# Patient Record
Sex: Female | Born: 1970 | Race: White | Hispanic: No | Smoking: Never smoker
Health system: Southern US, Community
[De-identification: ages and names within clinical notes are randomized; demographics above are authoritative.]

## PROBLEM LIST (undated history)

## (undated) DIAGNOSIS — Z973 Presence of spectacles and contact lenses: Secondary | ICD-10-CM

## (undated) DIAGNOSIS — R112 Nausea with vomiting, unspecified: Secondary | ICD-10-CM

## (undated) DIAGNOSIS — Z9889 Other specified postprocedural states: Secondary | ICD-10-CM

## (undated) DIAGNOSIS — N920 Excessive and frequent menstruation with regular cycle: Secondary | ICD-10-CM

## (undated) DIAGNOSIS — D649 Anemia, unspecified: Secondary | ICD-10-CM

## (undated) DIAGNOSIS — D259 Leiomyoma of uterus, unspecified: Secondary | ICD-10-CM

## (undated) HISTORY — PX: SPINE SURGERY: SHX786

## (undated) HISTORY — PX: KNEE SURGERY: SHX244

## (undated) HISTORY — DX: Anemia, unspecified: D64.9

---

## 1997-02-04 HISTORY — PX: WRIST SURGERY: SHX841

## 1997-08-01 ENCOUNTER — Encounter (HOSPITAL_COMMUNITY): Admission: RE | Admit: 1997-08-01 | Discharge: 1997-10-30 | Payer: Self-pay | Admitting: *Deleted

## 1998-02-04 HISTORY — PX: DILATION AND CURETTAGE OF UTERUS: SHX78

## 1998-03-02 ENCOUNTER — Other Ambulatory Visit: Admission: RE | Admit: 1998-03-02 | Discharge: 1998-03-02 | Payer: Self-pay | Admitting: Obstetrics & Gynecology

## 1999-01-19 ENCOUNTER — Ambulatory Visit (HOSPITAL_COMMUNITY): Admission: RE | Admit: 1999-01-19 | Discharge: 1999-01-19 | Payer: Self-pay | Admitting: Obstetrics and Gynecology

## 1999-02-22 ENCOUNTER — Ambulatory Visit (HOSPITAL_COMMUNITY): Admission: RE | Admit: 1999-02-22 | Discharge: 1999-02-22 | Payer: Self-pay | Admitting: Obstetrics and Gynecology

## 1999-02-22 ENCOUNTER — Encounter: Payer: Self-pay | Admitting: Obstetrics and Gynecology

## 1999-03-23 ENCOUNTER — Other Ambulatory Visit: Admission: RE | Admit: 1999-03-23 | Discharge: 1999-03-23 | Payer: Self-pay | Admitting: Obstetrics and Gynecology

## 1999-04-19 ENCOUNTER — Encounter: Payer: Self-pay | Admitting: Orthopedic Surgery

## 1999-04-19 ENCOUNTER — Ambulatory Visit (HOSPITAL_COMMUNITY): Admission: RE | Admit: 1999-04-19 | Discharge: 1999-04-19 | Payer: Self-pay | Admitting: Orthopedic Surgery

## 2000-01-17 ENCOUNTER — Inpatient Hospital Stay (HOSPITAL_COMMUNITY): Admission: AD | Admit: 2000-01-17 | Discharge: 2000-01-17 | Payer: Self-pay | Admitting: Obstetrics and Gynecology

## 2000-01-18 ENCOUNTER — Inpatient Hospital Stay (HOSPITAL_COMMUNITY): Admission: AD | Admit: 2000-01-18 | Discharge: 2000-01-18 | Payer: Self-pay | Admitting: Obstetrics and Gynecology

## 2000-03-13 ENCOUNTER — Inpatient Hospital Stay (HOSPITAL_COMMUNITY): Admission: AD | Admit: 2000-03-13 | Discharge: 2000-03-15 | Payer: Self-pay | Admitting: Obstetrics and Gynecology

## 2000-04-25 ENCOUNTER — Other Ambulatory Visit: Admission: RE | Admit: 2000-04-25 | Discharge: 2000-04-25 | Payer: Self-pay | Admitting: Obstetrics and Gynecology

## 2002-03-17 ENCOUNTER — Other Ambulatory Visit: Admission: RE | Admit: 2002-03-17 | Discharge: 2002-03-17 | Payer: Self-pay | Admitting: Obstetrics & Gynecology

## 2003-05-17 ENCOUNTER — Other Ambulatory Visit: Admission: RE | Admit: 2003-05-17 | Discharge: 2003-05-17 | Payer: Self-pay | Admitting: Obstetrics & Gynecology

## 2004-11-06 ENCOUNTER — Other Ambulatory Visit: Admission: RE | Admit: 2004-11-06 | Discharge: 2004-11-06 | Payer: Self-pay | Admitting: Obstetrics & Gynecology

## 2008-08-17 ENCOUNTER — Encounter: Admission: RE | Admit: 2008-08-17 | Discharge: 2008-08-17 | Payer: Self-pay | Admitting: Obstetrics & Gynecology

## 2009-09-04 ENCOUNTER — Encounter: Admission: RE | Admit: 2009-09-04 | Discharge: 2009-09-04 | Payer: Self-pay | Admitting: Obstetrics & Gynecology

## 2010-05-01 ENCOUNTER — Encounter: Payer: Self-pay | Admitting: Family Medicine

## 2010-05-01 ENCOUNTER — Ambulatory Visit (INDEPENDENT_AMBULATORY_CARE_PROVIDER_SITE_OTHER): Payer: BC Managed Care – PPO | Admitting: Family Medicine

## 2010-05-01 VITALS — BP 90/60 | HR 90 | Temp 98.6°F | Wt 110.0 lb

## 2010-05-01 DIAGNOSIS — J029 Acute pharyngitis, unspecified: Secondary | ICD-10-CM

## 2010-05-01 LAB — CBC WITH DIFFERENTIAL/PLATELET
Basophils Absolute: 0 10*3/uL (ref 0.0–0.1)
Basophils Relative: 0.5 % (ref 0.0–3.0)
Eosinophils Absolute: 0.1 10*3/uL (ref 0.0–0.7)
Eosinophils Relative: 2 % (ref 0.0–5.0)
HCT: 36.7 % (ref 36.0–46.0)
Hemoglobin: 12.3 g/dL (ref 12.0–15.0)
Lymphocytes Relative: 22.8 % (ref 12.0–46.0)
Lymphs Abs: 1.1 10*3/uL (ref 0.7–4.0)
MCHC: 33.4 g/dL (ref 30.0–36.0)
MCV: 84.2 fl (ref 78.0–100.0)
Monocytes Absolute: 0.6 10*3/uL (ref 0.1–1.0)
Monocytes Relative: 11 % (ref 3.0–12.0)
Neutro Abs: 3.2 10*3/uL (ref 1.4–7.7)
Neutrophils Relative %: 63.7 % (ref 43.0–77.0)
Platelets: 264 10*3/uL (ref 150.0–400.0)
RBC: 4.36 Mil/uL (ref 3.87–5.11)
RDW: 14.8 % — ABNORMAL HIGH (ref 11.5–14.6)
WBC: 5 10*3/uL (ref 4.5–10.5)

## 2010-05-01 MED ORDER — METHYLPREDNISOLONE ACETATE 80 MG/ML IJ SUSP
120.0000 mg | Freq: Once | INTRAMUSCULAR | Status: AC
  Administered 2010-05-01: 120 mg via INTRAMUSCULAR

## 2010-05-01 MED ORDER — DOXYCYCLINE HYCLATE 100 MG PO CAPS: 100.0000 mg | ORAL_CAPSULE | Freq: Two times a day (BID) | ORAL | Status: AC

## 2010-05-01 NOTE — Progress Notes (Signed)
  Subjective:    Patient ID: Tricia Rodriguez, female    DOB: [birth year], 40 y.o.   MRN: [id number]  HPI 40 yr old female to establish with [place] and for 10 days of upper respiratory symptoms. She had previously seen some Urgent Care doctors in [place] before coming to [place]. About 10 days ago she developed the sudden onset of a severe ST, low grade fever, PND, and a dry cough. Then a few days later she also developed burning and redness in both eyes with a yellow DC. She never had body aches and never had any GI symptoms. She was seen at Urgent Care and diagnosed with "a sinus infection, bilateral ear infections, and pinkeye". She was placed on 10 days of Biaxin and given eye drops. Her eyes are better now and the fever is gone. The cough is much better, but she still has a ST. Her main complaint today is swelling and pain in the left anterior neck below the ear and the jaw. She is taking Motrin 3 or 4 times a day.    Review of Systems  Constitutional: Negative.   HENT: Positive for sore throat, trouble swallowing, neck pain and postnasal drip. Negative for hearing loss, ear pain, nosebleeds, congestion, facial swelling, rhinorrhea, sneezing, neck stiffness, dental problem, voice change, sinus pressure, tinnitus and ear discharge.   Eyes: Negative.   Respiratory: Positive for cough. Negative for choking, chest tightness, shortness of breath and wheezing.   Cardiovascular: Negative.   Gastrointestinal: Negative.        Objective:   Physical Exam  Constitutional: She appears well-developed and well-nourished. No distress.  HENT:  Head: Normocephalic and atraumatic.  Right Ear: External ear normal.  Left Ear: External ear normal.  Nose: Nose normal.  Mouth/Throat: Oropharynx is clear and moist. No oropharyngeal exudate.  Eyes: Conjunctivae are normal. Pupils are equal, round, and reactive to light. Right eye exhibits no discharge. Left eye exhibits no discharge.  Neck: Normal range of motion. Neck  supple. No thyromegaly present.       She has a few mildly enlarged lymph nodes in the left anterior cervical chain. These are tender.   Cardiovascular: Normal rate, regular rhythm, normal heart sounds and intact distal pulses.   Pulmonary/Chest: Effort normal and breath sounds normal. No respiratory distress. She has no wheezes. She has no rales. She exhibits no tenderness.          Assessment & Plan:  This is most consistent with a viral etiology, but we will continue to cover with antibiotics. Stop the Biaxin and go to Doxycycline. Given a steroid shot for symptomatic relief. Check a CBC.

## 2010-05-02 ENCOUNTER — Telehealth: Payer: Self-pay

## 2010-05-02 ENCOUNTER — Ambulatory Visit: Payer: Self-pay | Admitting: Family Medicine

## 2010-05-02 NOTE — Telephone Encounter (Signed)
Pt aware.

## 2010-05-02 NOTE — Telephone Encounter (Signed)
Message copied by Madison Hickman on Wed May 02, 2010  9:31 AM ------      Message from: Dwaine Deter      Created: Wed May 02, 2010  8:30 AM       normal

## 2010-06-22 NOTE — Discharge Summary (Signed)
[facility]  Patient:    Tricia Rodriguez, Tricia Rodriguez                          MRN: [id number] Adm. Date:  03/13/00 Disc. Date: 03/15/00 Attending:  Caralyn Guile. Arlyce Dice, M.D. Dictator:   Leilani Able, P.A.                           Discharge Summary  FINAL DIAGNOSES:              1. Intrauterine pregnancy at 40-2/[redacted] weeks                                  gestation.                               2. Moderate to severe variable fetal heart rate                                  decelerations.                               3. Positive group B streptococcus.  PROCEDURE:                    Vacuum-assisted vaginal delivery.  SURGEON:                      Brook A. Edward Jolly, M.D.  COMPLICATIONS:                None.  HISTORY/HOSPITAL COURSE:      This 40 year old, G3, P1-0-1-1, presents at 40-2/[redacted] weeks gestation in active labor. The patients cervix was about 6-7 cm dilated upon admission. She was contracting regularly. She received clindamycin IV for her positive group B strep status, and went on to have spontaneous rupture of membranes. Then, the patient dilated to complete and complete. During this active stage of labor, the patient did have two episodes of bradycardia which resolved, and then was followed by fetal heart rate accelerations and reassuring fetal assessment. When the patient did begin pushing, moderate to severe variable decelerations were noted. These did resolve with position change, but they did reoccur, however. So, the recommendation was to proceed with a vacuum-assisted delivery. Mityvac was applied over the vertex, midline episiotomy was performed, and the patient delivered a 7-pound 2-ounce female infant with Apgars of 7 and 9 on the third contraction. Delivery went without complication. The perineum was repaired and there were no complications.  The patients postpartum course was benign without significant fevers. Her little boy was circumcised and  she was felt ready for discharge on postpartum day #2.  DISCHARGE INSTRUCTIONS:       She was sent home on a regular diet and told to decrease activities.  DISCHARGE MEDICATIONS:        She was given Darvocet-N 100, #20, one every four hours as needed for pain. She was told she could use over-the-counter pain medicines and to continue her prenatal vitamins.  DISCHARGE FOLLOWUP:           She is to follow up in the office in four weeks. D:  04/04/00 TD:  04/05/00 Job: [id number]

## 2010-06-22 NOTE — Op Note (Signed)
[facility]  Patient:    Tricia Rodriguez, Tricia Rodriguez                          MRN: [id number] Proc. Date: 03/13/00 Adm. Date:  [id number] Disc. Date: [id number] Attending:  Miguel Aschoff                           Operative Report  PREOPERATIVE DIAGNOSES:       1. Intrauterine gestation at 40 plus two weeks.                               2. Moderate to severe variable fetal heart rate                                  decelerations.                               3. Positive Group B strep status.  POSTOPERATIVE DIAGNOSES:      1. Intrauterine gestation at 40 plus two weeks.                               2. Moderate to severe variable fetal heart rate                                  decelerations.                               3. Positive Group B strep status.  SURGEON:                      Brook A. Edward Jolly, M.D.  INDICATIONS FOR PROCEDURE:    The patient is a 40 year old gravida 3, para 1-0-1-1 Caucasian female admitted at 11 plus two weeks gestation in active labor.  The patients cervix was noted to be 6-7 cm of dilatation, and she was contracting regularly.  The patient did receive clindamycin intravenously for her positive Group B strep status, and she went on to spontaneously rupture of membranes and then shortly thereafter have complete cervical dilatation.  During the patients active stage of labor she did have two episodes of bradycardia, which spontaneously resolved and was followed by fetal heart accelerations and a reassuring fetal assessment.  Now when the patient did begin with pushing, she was noted to have moderate to severe variable decelerations, which did resolve with position changes.  These did recur however, the recommendation was made to proceed with a vacuum-assisted vaginal delivery at this time.  FINDINGS:                     A viable female infant was delivered at 1713 h in the occiput anterior position.  There was no nuchal cord noted.  The  Apgars were noted to be 7 at one minute and 9 at five minutes.  The weight was noted to be 7 pounds and 2 ounces.  The placenta delivered spontaneously and was intact, and had a normal insertion of a three-vessel cord.  DESCRIPTION OF PROCEDURE:  With an IV and an epidural in place, the patient was re-examined and the vertex was noted to be at the +2 station with the caput at the +3 station.  The Foley catheter was removed from the bladder, and the patients perineum was sterilely prepped and then draped.  The Mighty-Vac was applied over the vertex, and the perineum was injected with 1% lidocaine. A midline episiotomy was performed, and the delivery was accomplished over the course of three contractions.  The cord was doubly clamped and cut, and the infant was noted to be vigorous at birth.  No gross abnormalities were appreciated.  The placenta delivered spontaneously and intact, with a normal insertion of a three-vessel cord.  The perineum was re-injected with 1% lidocaine, and the episiotomy was repaired in standard fashion with 0 Vicryl and 2-0 Vicryl.  A right labial laceration was nonbleeding and was therefore not sutured.  The patient did receive Pitocin 20 units intravenously after delivery, and the estimated blood loss was measured at approximately 400 cc.  There were no complications for the procedure.  All sponge, needle and instrument counts were correct. DD:  03/13/00 TD:  03/15/00 Job: [id number]

## 2010-12-21 ENCOUNTER — Other Ambulatory Visit: Payer: Self-pay | Admitting: Obstetrics & Gynecology

## 2010-12-21 DIAGNOSIS — Z1231 Encounter for screening mammogram for malignant neoplasm of breast: Secondary | ICD-10-CM

## 2011-01-23 ENCOUNTER — Ambulatory Visit
Admission: RE | Admit: 2011-01-23 | Discharge: 2011-01-23 | Disposition: A | Discharge status: Home / Self Care | Payer: BC Managed Care – PPO | Source: Ambulatory Visit | Attending: Obstetrics & Gynecology | Admitting: Obstetrics & Gynecology

## 2011-01-23 DIAGNOSIS — Z1231 Encounter for screening mammogram for malignant neoplasm of breast: Secondary | ICD-10-CM

## 2011-07-15 ENCOUNTER — Encounter: Payer: Self-pay | Admitting: Family Medicine

## 2011-07-15 ENCOUNTER — Ambulatory Visit (INDEPENDENT_AMBULATORY_CARE_PROVIDER_SITE_OTHER): Payer: BC Managed Care – PPO | Admitting: Family Medicine

## 2011-07-15 VITALS — BP 102/60 | HR 70 | Temp 98.3°F | Ht 64.75 in | Wt 113.0 lb

## 2011-07-15 DIAGNOSIS — R002 Palpitations: Secondary | ICD-10-CM

## 2011-07-15 DIAGNOSIS — Z0000 Encounter for general adult medical examination without abnormal findings: Secondary | ICD-10-CM

## 2011-07-15 LAB — LIPID PANEL
Cholesterol: 152 mg/dL (ref 0–200)
HDL: 68.6 mg/dL (ref >39.00)
Total CHOL/HDL Ratio: 2
Triglycerides: 60 mg/dL (ref 0.0–149.0)

## 2011-07-15 LAB — POCT URINALYSIS DIPSTICK
Blood, UA: NEGATIVE
Glucose, UA: NEGATIVE
Nitrite, UA: NEGATIVE
Protein, UA: NEGATIVE
Spec Grav, UA: 1.025
Urobilinogen, UA: 0.2

## 2011-07-15 LAB — HEPATIC FUNCTION PANEL
ALT: 21 U/L (ref 0–35)
Albumin: 4.1 g/dL (ref 3.5–5.2)
Bilirubin, Direct: 0.1 mg/dL (ref 0.0–0.3)
Total Protein: 7.1 g/dL (ref 6.0–8.3)

## 2011-07-15 LAB — CBC WITH DIFFERENTIAL/PLATELET
Basophils Absolute: 0 10*3/uL (ref 0.0–0.1)
Eosinophils Absolute: 0 10*3/uL (ref 0.0–0.7)
HCT: 35 % — ABNORMAL LOW (ref 36.0–46.0)
Lymphs Abs: 1.2 10*3/uL (ref 0.7–4.0)
MCV: 77.9 fl — ABNORMAL LOW (ref 78.0–100.0)
Monocytes Absolute: 0.5 10*3/uL (ref 0.1–1.0)
Neutrophils Relative %: 62.8 % (ref 43.0–77.0)
Platelets: 211 10*3/uL (ref 150.0–400.0)
RDW: 16 % — ABNORMAL HIGH (ref 11.5–14.6)

## 2011-07-15 LAB — BASIC METABOLIC PANEL
CO2: 26 mEq/L (ref 19–32)
Calcium: 8.6 mg/dL (ref 8.4–10.5)
Chloride: 108 mEq/L (ref 96–112)
Creatinine, Ser: 0.6 mg/dL (ref 0.4–1.2)
Glucose, Bld: 82 mg/dL (ref 70–99)

## 2011-07-15 NOTE — Progress Notes (Signed)
Subjective:    Patient ID: Tricia Rodriguez, female    DOB: [birth year], 41 y.o.   MRN: [id number]  HPI 41 yr old female for a cpx. She has not had one in years. She sees Dr. Aldona Bar regularly for GYN exams. She has a few issues to discuss today. First she has put on about 6 lbs of weight in the past year with no changes in diet or exercise. She works out lifting weights 3 days a week and runs every day. She mentions occasional "skips" in her heartbeat, but these are not associated with any symptoms. She is currently being assessed for possible ADHD by Dr. Eliott Nine along with her son. She feels scattered all the time and has trouble focusing on tasks. She has a family hx of ADHD, and both her mother and sister take Ritailin for this. Shambhavi once tried Ritatin for a few days but had to stop it because it made her shaky and nervous. She has some anxiety as well, and it is not clear to what extent if any that ADHD may be playing a role in this. She is never depressed. She sleeps well. She once tried low dose Lexapro for about a week but she stopped this because it made her feel emotionally flat.  She still has regular menses, although she is starting to have some mild hot flashes at night. 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    Review of Systems  Constitutional: Negative.   HENT: Negative.   Eyes: Negative.   Respiratory: Negative.   Cardiovascular: Negative.   Gastrointestinal: Negative.   Genitourinary: Negative for dysuria, urgency, frequency, hematuria, flank pain, decreased urine volume, enuresis, difficulty urinating, pelvic pain and dyspareunia.  Musculoskeletal: Negative.   Skin: Negative.   Neurological: Negative.   Hematological: Negative.   Psychiatric/Behavioral: Negative.        Objective:   Physical Exam  Constitutional: She is oriented to person, place, and time. She appears well-developed and well-nourished. No distress.  HENT:  Head: Normocephalic and  atraumatic.  Right Ear: External ear normal.  Left Ear: External ear normal.  Nose: Nose normal.  Mouth/Throat: Oropharynx is clear and moist. No oropharyngeal exudate.  Eyes: Conjunctivae and EOM are normal. Pupils are equal, round, and reactive to light. No scleral icterus.  Neck: Normal range of motion. Neck supple. No JVD present. No thyromegaly present.  Cardiovascular: Normal rate, regular rhythm, normal heart sounds and intact distal pulses.  Exam reveals no gallop and no friction rub.   No murmur heard.      EKG normal but with a short PR interval   Pulmonary/Chest: Effort normal and breath sounds normal. No respiratory distress. She has no wheezes. She has no rales. She exhibits no tenderness.  Abdominal: Soft. Bowel sounds are normal. She exhibits no distension and no mass. There is no tenderness. There is no rebound and no guarding.  Musculoskeletal: Normal range of motion. She exhibits no edema and no tenderness.  Lymphadenopathy:    She has no cervical adenopathy.  Neurological: She is alert and oriented to person, place, and time. She has normal reflexes. No cranial nerve deficit. She exhibits normal muscle tone. Coordination normal.  Skin: Skin is warm and dry. No rash noted. No erythema.  Psychiatric: She has a normal mood and affect. Her behavior is normal. Judgment and thought content normal.          Assessment & Plan:  Well exam. Get fasting labs. I  asked her to have Dr. Wyn Quaker send me a copy of her recommendations. If she does have ADHD, we can talk about treatment options.

## 2012-01-14 ENCOUNTER — Other Ambulatory Visit: Payer: Self-pay | Admitting: Obstetrics & Gynecology

## 2012-01-14 DIAGNOSIS — Z1231 Encounter for screening mammogram for malignant neoplasm of breast: Secondary | ICD-10-CM

## 2012-02-20 ENCOUNTER — Ambulatory Visit
Admission: RE | Admit: 2012-02-20 | Discharge: 2012-02-20 | Disposition: A | Discharge status: Home / Self Care | Payer: BC Managed Care – PPO | Source: Ambulatory Visit | Attending: Obstetrics & Gynecology | Admitting: Obstetrics & Gynecology

## 2012-02-20 DIAGNOSIS — Z1231 Encounter for screening mammogram for malignant neoplasm of breast: Secondary | ICD-10-CM

## 2013-01-12 ENCOUNTER — Telehealth: Payer: Self-pay | Admitting: Family Medicine

## 2013-01-12 NOTE — Telephone Encounter (Signed)
Okay to schedule

## 2013-01-12 NOTE — Telephone Encounter (Signed)
Pt thinks she may have bronchitis. Pt states it hurts when she coughs. Only same day appts. pls advise.

## 2013-01-12 NOTE — Telephone Encounter (Signed)
scheduled

## 2013-01-13 ENCOUNTER — Encounter: Payer: Self-pay | Admitting: Family Medicine

## 2013-01-13 ENCOUNTER — Ambulatory Visit (INDEPENDENT_AMBULATORY_CARE_PROVIDER_SITE_OTHER): Payer: BC Managed Care – PPO | Admitting: Family Medicine

## 2013-01-13 VITALS — BP 110/80 | HR 85 | Temp 99.0°F | Wt 111.0 lb

## 2013-01-13 DIAGNOSIS — J40 Bronchitis, not specified as acute or chronic: Secondary | ICD-10-CM

## 2013-01-13 MED ORDER — AZITHROMYCIN 250 MG PO TABS: ORAL_TABLET | ORAL | Status: DC

## 2013-01-13 MED ORDER — HYDROCODONE-HOMATROPINE 5-1.5 MG/5ML PO SYRP: 5.0000 mL | ORAL_SOLUTION | ORAL | Status: DC | PRN

## 2013-01-13 NOTE — Progress Notes (Signed)
Pre visit review using our clinic review tool, if applicable. No additional management support is needed unless otherwise documented below in the visit note. 

## 2013-01-13 NOTE — Progress Notes (Signed)
   Subjective:    Patient ID: Tricia Rodriguez, female    DOB: Sep 10, 1970, 42 y.o.   MRN: [id number]  HPI Here for 4 weeks of coughing which has gotten deeper. Her chest is tight but the cough is dry. Low grade fevers. Using Tylenol Cold and Sinus.    Review of Systems  Constitutional: Positive for fever.  HENT: Positive for congestion.   Eyes: Negative.   Respiratory: Positive for cough.        Objective:   Physical Exam  Constitutional: She appears well-developed and well-nourished. No distress.  HENT:  Right Ear: External ear normal.  Left Ear: External ear normal.  Nose: Nose normal.  Mouth/Throat: Oropharynx is clear and moist.  Eyes: Conjunctivae are normal.  Pulmonary/Chest: Effort normal. No respiratory distress. She has no wheezes. She has no rales.  Scattered rhonchi   Lymphadenopathy:    She has no cervical adenopathy.          Assessment & Plan:  Add Mucinex

## 2013-02-02 ENCOUNTER — Other Ambulatory Visit: Payer: Self-pay

## 2013-02-02 DIAGNOSIS — Z1231 Encounter for screening mammogram for malignant neoplasm of breast: Secondary | ICD-10-CM

## 2013-02-25 ENCOUNTER — Ambulatory Visit
Admission: RE | Admit: 2013-02-25 | Discharge: 2013-02-25 | Disposition: A | Discharge status: Home / Self Care | Payer: BC Managed Care – PPO | Source: Ambulatory Visit

## 2013-02-25 DIAGNOSIS — Z1231 Encounter for screening mammogram for malignant neoplasm of breast: Secondary | ICD-10-CM

## 2014-02-03 ENCOUNTER — Other Ambulatory Visit: Payer: Self-pay

## 2014-02-03 DIAGNOSIS — Z1231 Encounter for screening mammogram for malignant neoplasm of breast: Secondary | ICD-10-CM

## 2014-02-28 ENCOUNTER — Ambulatory Visit: Payer: BC Managed Care – PPO

## 2014-03-07 ENCOUNTER — Ambulatory Visit
Admission: RE | Admit: 2014-03-07 | Discharge: 2014-03-07 | Disposition: A | Discharge status: Home / Self Care | Payer: BC Managed Care – PPO | Source: Ambulatory Visit

## 2014-03-07 DIAGNOSIS — Z1231 Encounter for screening mammogram for malignant neoplasm of breast: Secondary | ICD-10-CM

## 2014-03-08 ENCOUNTER — Encounter: Payer: Self-pay | Admitting: Family Medicine

## 2014-03-08 ENCOUNTER — Ambulatory Visit (INDEPENDENT_AMBULATORY_CARE_PROVIDER_SITE_OTHER): Payer: BC Managed Care – PPO | Admitting: Family Medicine

## 2014-03-08 VITALS — BP 124/78 | HR 86 | Temp 99.2°F | Ht 64.75 in | Wt 114.0 lb

## 2014-03-08 DIAGNOSIS — J209 Acute bronchitis, unspecified: Secondary | ICD-10-CM

## 2014-03-08 MED ORDER — AZITHROMYCIN 250 MG PO TABS: ORAL_TABLET | ORAL | Status: DC

## 2014-03-08 MED ORDER — HYDROCODONE-HOMATROPINE 5-1.5 MG/5ML PO SYRP: 5.0000 mL | ORAL_SOLUTION | ORAL | Status: DC | PRN

## 2014-03-08 NOTE — Progress Notes (Signed)
Pre visit review using our clinic review tool, if applicable. No additional management support is needed unless otherwise documented below in the visit note. 

## 2014-03-08 NOTE — Progress Notes (Signed)
   Subjective:    Patient ID: Tricia Rodriguez, female    DOB: 1970-06-20, 44 y.o.   MRN: [id number]  HPI Here for 5 days of chest tightness and burning with a dry cough. No ST.    Review of Systems  Constitutional: Positive for fever.  HENT: Positive for congestion, postnasal drip and sinus pressure.   Eyes: Negative.   Respiratory: Positive for cough and chest tightness.        Objective:   Physical Exam  Constitutional: She appears well-developed and well-nourished.  HENT:  Right Ear: External ear normal.  Left Ear: External ear normal.  Nose: Nose normal.  Mouth/Throat: Oropharynx is clear and moist.  Eyes: Conjunctivae are normal.  Pulmonary/Chest: Effort normal and breath sounds normal.  Lymphadenopathy:    She has no cervical adenopathy.          Assessment & Plan:  Add Mucinex

## 2014-03-14 ENCOUNTER — Encounter: Payer: Self-pay | Admitting: Family Medicine

## 2014-03-14 ENCOUNTER — Ambulatory Visit (INDEPENDENT_AMBULATORY_CARE_PROVIDER_SITE_OTHER): Payer: BC Managed Care – PPO | Admitting: Family Medicine

## 2014-03-14 ENCOUNTER — Telehealth: Payer: Self-pay | Admitting: Family Medicine

## 2014-03-14 VITALS — BP 100/72 | HR 68 | Temp 97.9°F | Ht 63.75 in | Wt 112.4 lb

## 2014-03-14 DIAGNOSIS — J209 Acute bronchitis, unspecified: Secondary | ICD-10-CM

## 2014-03-14 DIAGNOSIS — J069 Acute upper respiratory infection, unspecified: Secondary | ICD-10-CM

## 2014-03-14 MED ORDER — PREDNISONE 20 MG PO TABS: 40.0000 mg | ORAL_TABLET | Freq: Every day | ORAL | Status: DC

## 2014-03-14 NOTE — Telephone Encounter (Signed)
Patient Name: Tricia Rodriguez  DOB: [birth year]    Initial Comment Caller states she was given cough medicine for her bronchitis. She is still constantly coughing   Nurse Assessment  Nurse: Thad Ranger, RN, Langley Gauss Date/Time (Eastern Time): 03/14/2014 2:19:01 PM  Confirm and document reason for call. If symptomatic, describe symptoms. ---Caller states she was given cough medicine for her bronchitis. She is still constantly coughing. Seen last Tues by Dr Sarajane Jews and give a Rx for ZPack and cough med w/codiene. States she feels better but does not have any energy. States she cont to have a very persistent cough and feels like her chest is tight.  Has the patient traveled out of the country within the last 30 days? ---Not Applicable  Does the patient require triage? ---Yes  Related visit to physician within the last 2 weeks? ---Yes  Does the PT have any chronic conditions? (i.e. diabetes, asthma, etc.) ---No  Did the patient indicate they were pregnant? ---No     Guidelines    Guideline Title Affirmed Question Affirmed Notes  Cough - Acute Non-Productive [1] Continuous (nonstop) coughing interferes with work or school AND [2] no improvement using cough treatment per protocol    Final Disposition User   See Physician within 24 Hours Carmon, RN, Langley Gauss    Comments  Appt made today with Dr Jarrett Soho at [facility]. Pt aware/agreeable.

## 2014-03-14 NOTE — Progress Notes (Signed)
HPI:  URI -started: about 8 days ago -symptoms:nasal congestion, sore throat, cough, chest tightness - cough first, now with nasal congestion, PND, wheezing, persistent cough -denies:fever, SOB, NVD, tooth pain -has tried: saw PCP 2/2 and given zpack and hycodan - reports better but with persistent cough -sick contacts/travel/risks: denies flu exposure, tick exposure or or Ebola risks -Hx of: allergies, reports hx of bronchitis with upper resp infections requiring "a shot of steroid" ROS: See pertinent positives and negatives per HPI.  Past Medical History  Diagnosis Date  . Anemia   . Allergy     Past Surgical History  Procedure Laterality Date  . Knee surgery      Family History  Problem Relation Age of Onset  . Alcohol abuse Mother   . Arthritis Mother   . Hyperlipidemia Father   . Hypertension Father   . Diabetes Maternal Grandmother   . Alcohol abuse Maternal Grandfather   . Diabetes Paternal Grandmother     History   Social History  . Marital Status: Single    Spouse Name: N/A    Number of Children: N/A  . Years of Education: N/A   Social History Main Topics  . Smoking status: Never Smoker   . Smokeless tobacco: Never Used  . Alcohol Use: 0.0 oz/week    0 Not specified per week     Comment: occ  . Drug Use: No  . Sexual Activity: None   Other Topics Concern  . None   Social History Narrative     Current outpatient prescriptions:  .  HYDROcodone-homatropine (HYDROMET) 5-1.5 MG/5ML syrup, Take 5 mLs by mouth every 4 (four) hours as needed for cough., Disp: 240 mL, Rfl: 0 .  predniSONE (DELTASONE) 20 MG tablet, Take 2 tablets (40 mg total) by mouth daily with breakfast., Disp: 8 tablet, Rfl: 0  EXAM:  Filed Vitals:   03/14/14 1522  BP: 100/72  Pulse: 68  Temp: 97.9 F (36.6 C)    Body mass index is 19.45 kg/(m^2).  GENERAL: vitals reviewed and listed above, alert, oriented, appears well hydrated and in no acute distress  HEENT:  atraumatic, conjunttiva clear, no obvious abnormalities on inspection of external nose and ears, normal appearance of ear canals and TMs, clear nasal congestion, mild post oropharyngeal erythema with PND, no tonsillar edema or exudate, no sinus TTP  NECK: no obvious masses on inspection  LUNGS: few exp wheezes, no rhonchi  CV: HRRR, no peripheral edema  MS: moves all extremities without noticeable abnormality  PSYCH: pleasant and cooperative, no obvious depression or anxiety  ASSESSMENT AND PLAN:  Discussed the following assessment and plan:  Acute bronchitis, unspecified organism  Acute upper respiratory infection  - We discussed potential etiologies, with VURI with acute bronchitis being most likely.We discussed treatment side effects, likely course, antibiotic misuse, transmission, and signs of developing a serious illness. -opted for steroid burst after discussion risks/benefits and close follow up advised in not improving -of course, we advised to return or notify a doctor immediately if symptoms worsen or persist or new concerns arise.    Patient Instructions  Acute Bronchitis Bronchitis is when the airways that extend from the windpipe into the lungs get red, puffy, and painful (inflamed). Bronchitis often causes thick spit (mucus) to develop. This leads to a cough. A cough is the most common symptom of bronchitis. In acute bronchitis, the condition usually begins suddenly and goes away over time (usually in 2 weeks). Smoking, allergies, and asthma can make bronchitis  worse. Repeated episodes of bronchitis may cause more lung problems. HOME CARE  Rest.  Drink enough fluids to keep your pee (urine) clear or pale yellow (unless you need to limit fluids as told by your doctor).  Only take over-the-counter or prescription medicines as told by your doctor.  Avoid smoking and secondhand smoke. These can make bronchitis worse. If you are a smoker, think about using nicotine gum  or skin patches. Quitting smoking will help your lungs heal faster.  Reduce the chance of getting bronchitis again by:  Washing your hands often.  Avoiding people with cold symptoms.  Trying not to touch your hands to your mouth, nose, or eyes.  Follow up with your doctor as told. GET HELP IF: Your symptoms do not improve after 1 week of treatment. Symptoms include:  Cough.  Fever.  Coughing up thick spit.  Body aches.  Chest congestion.  Chills.  Shortness of breath.  Sore throat. GET HELP RIGHT AWAY IF:   You have an increased fever.  You have chills.  You have severe shortness of breath.  You have bloody thick spit (sputum).  You throw up (vomit) often.  You lose too much body fluid (dehydration).  You have a severe headache.  You faint. MAKE SURE YOU:   Understand these instructions.  Will watch your condition.  Will get help right away if you are not doing well or get worse. Document Released: 07/10/2007 Document Revised: 09/23/2012 Document Reviewed: 07/14/2012 [facility] Patient Information 2015 [place], [place]. This information is not intended to replace advice given to you by your health care provider. Make sure you discuss any questions you have with your health care provider.   Prednisone tablets What is this medicine? PREDNISONE (PRED ni sone) is a corticosteroid. It is commonly used to treat inflammation of the skin, joints, lungs, and other organs. Common conditions treated include asthma, allergies, and arthritis. It is also used for other conditions, such as blood disorders and diseases of the adrenal glands. This medicine may be used for other purposes; ask your health care provider or pharmacist if you have questions. COMMON BRAND NAME(S): Deltasone, Predone, Sterapred, Sterapred DS What should I tell my health care provider before I take this medicine? They need to know if you have any of these conditions: -Cushing's  syndrome -diabetes -glaucoma -heart disease -high blood pressure -infection (especially a virus infection such as chickenpox, cold sores, or herpes) -kidney disease -liver disease -mental illness -myasthenia gravis -osteoporosis -seizures -stomach or intestine problems -thyroid disease -an unusual or allergic reaction to lactose, prednisone, other medicines, foods, dyes, or preservatives -pregnant or trying to get pregnant -breast-feeding How should I use this medicine? Take this medicine by mouth with a glass of water. Follow the directions on the prescription label. Take this medicine with food. If you are taking this medicine once a day, take it in the morning. Do not take more medicine than you are told to take. Do not suddenly stop taking your medicine because you may develop a severe reaction. Your doctor will tell you how much medicine to take. If your doctor wants you to stop the medicine, the dose may be slowly lowered over time to avoid any side effects. Talk to your pediatrician regarding the use of this medicine in children. Special care may be needed. Overdosage: If you think you have taken too much of this medicine contact a poison control center or emergency room at once. NOTE: This medicine is only for you. Do not  share this medicine with others. What if I miss a dose? If you miss a dose, take it as soon as you can. If it is almost time for your next dose, talk to your doctor or health care professional. You may need to miss a dose or take an extra dose. Do not take double or extra doses without advice. What may interact with this medicine? Do not take this medicine with any of the following medications: -metyrapone -mifepristone This medicine may also interact with the following medications: -aminoglutethimide -amphotericin B -aspirin and aspirin-like medicines -barbiturates -certain medicines for diabetes, like glipizide or  glyburide -cholestyramine -cholinesterase inhibitors -cyclosporine -digoxin -diuretics -ephedrine -female hormones, like estrogens and birth control pills -isoniazid -ketoconazole -NSAIDS, medicines for pain and inflammation, like ibuprofen or naproxen -phenytoin -rifampin -toxoids -vaccines -warfarin This list may not describe all possible interactions. Give your health care provider a list of all the medicines, herbs, non-prescription drugs, or dietary supplements you use. Also tell them if you smoke, drink alcohol, or use illegal drugs. Some items may interact with your medicine. What should I watch for while using this medicine? Visit your doctor or health care professional for regular checks on your progress. If you are taking this medicine over a prolonged period, carry an identification card with your name and address, the type and dose of your medicine, and your doctor's name and address. This medicine may increase your risk of getting an infection. Tell your doctor or health care professional if you are around anyone with measles or chickenpox, or if you develop sores or blisters that do not heal properly. If you are going to have surgery, tell your doctor or health care professional that you have taken this medicine within the last twelve months. Ask your doctor or health care professional about your diet. You may need to lower the amount of salt you eat. This medicine may affect blood sugar levels. If you have diabetes, check with your doctor or health care professional before you change your diet or the dose of your diabetic medicine. What side effects may I notice from receiving this medicine? Side effects that you should report to your doctor or health care professional as soon as possible: -allergic reactions like skin rash, itching or hives, swelling of the face, lips, or tongue -changes in emotions or moods -changes in vision -depressed mood -eye pain -fever or chills,  cough, sore throat, pain or difficulty passing urine -increased thirst -swelling of ankles, feet Side effects that usually do not require medical attention (report to your doctor or health care professional if they continue or are bothersome): -confusion, excitement, restlessness -headache -nausea, vomiting -skin problems, acne, thin and shiny skin -trouble sleeping -weight gain This list may not describe all possible side effects. Call your doctor for medical advice about side effects. You may report side effects to FDA at 1-800-FDA-1088. Where should I keep my medicine? Keep out of the reach of children. Store at room temperature between 15 and 30 degrees C (59 and 86 degrees F). Protect from light. Keep container tightly closed. Throw away any unused medicine after the expiration date. NOTE: This sheet is a summary. It may not cover all possible information. If you have questions about this medicine, talk to your doctor, pharmacist, or health care provider.  2015, [employer] Standard. (2010-09-06 10:57:14)        Tricia Benton R.

## 2014-03-14 NOTE — Patient Instructions (Signed)
Acute Bronchitis Bronchitis is when the airways that extend from the windpipe into the lungs get red, puffy, and painful (inflamed). Bronchitis often causes thick spit (mucus) to develop. This leads to a cough. A cough is the most common symptom of bronchitis. In acute bronchitis, the condition usually begins suddenly and goes away over time (usually in 2 weeks). Smoking, allergies, and asthma can make bronchitis worse. Repeated episodes of bronchitis may cause more lung problems. HOME CARE  Rest.  Drink enough fluids to keep your pee (urine) clear or pale yellow (unless you need to limit fluids as told by your doctor).  Only take over-the-counter or prescription medicines as told by your doctor.  Avoid smoking and secondhand smoke. These can make bronchitis worse. If you are a smoker, think about using nicotine gum or skin patches. Quitting smoking will help your lungs heal faster.  Reduce the chance of getting bronchitis again by:  Washing your hands often.  Avoiding people with cold symptoms.  Trying not to touch your hands to your mouth, nose, or eyes.  Follow up with your doctor as told. GET HELP IF: Your symptoms do not improve after 1 week of treatment. Symptoms include:  Cough.  Fever.  Coughing up thick spit.  Body aches.  Chest congestion.  Chills.  Shortness of breath.  Sore throat. GET HELP RIGHT AWAY IF:   You have an increased fever.  You have chills.  You have severe shortness of breath.  You have bloody thick spit (sputum).  You throw up (vomit) often.  You lose too much body fluid (dehydration).  You have a severe headache.  You faint. MAKE SURE YOU:   Understand these instructions.  Will watch your condition.  Will get help right away if you are not doing well or get worse. Document Released: 07/10/2007 Document Revised: 09/23/2012 Document Reviewed: 07/14/2012 [facility] Patient Information 2015 [place], [place]. This information is not  intended to replace advice given to you by your health care provider. Make sure you discuss any questions you have with your health care provider.   Prednisone tablets What is this medicine? PREDNISONE (PRED ni sone) is a corticosteroid. It is commonly used to treat inflammation of the skin, joints, lungs, and other organs. Common conditions treated include asthma, allergies, and arthritis. It is also used for other conditions, such as blood disorders and diseases of the adrenal glands. This medicine may be used for other purposes; ask your health care provider or pharmacist if you have questions. COMMON BRAND NAME(S): Deltasone, Predone, Sterapred, Sterapred DS What should I tell my health care provider before I take this medicine? They need to know if you have any of these conditions: -Cushing's syndrome -diabetes -glaucoma -heart disease -high blood pressure -infection (especially a virus infection such as chickenpox, cold sores, or herpes) -kidney disease -liver disease -mental illness -myasthenia gravis -osteoporosis -seizures -stomach or intestine problems -thyroid disease -an unusual or allergic reaction to lactose, prednisone, other medicines, foods, dyes, or preservatives -pregnant or trying to get pregnant -breast-feeding How should I use this medicine? Take this medicine by mouth with a glass of water. Follow the directions on the prescription label. Take this medicine with food. If you are taking this medicine once a day, take it in the morning. Do not take more medicine than you are told to take. Do not suddenly stop taking your medicine because you may develop a severe reaction. Your doctor will tell you how much medicine to take. If your doctor wants  you to stop the medicine, the dose may be slowly lowered over time to avoid any side effects. Talk to your pediatrician regarding the use of this medicine in children. Special care may be needed. Overdosage: If you think you  have taken too much of this medicine contact a poison control center or emergency room at once. NOTE: This medicine is only for you. Do not share this medicine with others. What if I miss a dose? If you miss a dose, take it as soon as you can. If it is almost time for your next dose, talk to your doctor or health care professional. You may need to miss a dose or take an extra dose. Do not take double or extra doses without advice. What may interact with this medicine? Do not take this medicine with any of the following medications: -metyrapone -mifepristone This medicine may also interact with the following medications: -aminoglutethimide -amphotericin B -aspirin and aspirin-like medicines -barbiturates -certain medicines for diabetes, like glipizide or glyburide -cholestyramine -cholinesterase inhibitors -cyclosporine -digoxin -diuretics -ephedrine -female hormones, like estrogens and birth control pills -isoniazid -ketoconazole -NSAIDS, medicines for pain and inflammation, like ibuprofen or naproxen -phenytoin -rifampin -toxoids -vaccines -warfarin This list may not describe all possible interactions. Give your health care provider a list of all the medicines, herbs, non-prescription drugs, or dietary supplements you use. Also tell them if you smoke, drink alcohol, or use illegal drugs. Some items may interact with your medicine. What should I watch for while using this medicine? Visit your doctor or health care professional for regular checks on your progress. If you are taking this medicine over a prolonged period, carry an identification card with your name and address, the type and dose of your medicine, and your doctor's name and address. This medicine may increase your risk of getting an infection. Tell your doctor or health care professional if you are around anyone with measles or chickenpox, or if you develop sores or blisters that do not heal properly. If you are going to  have surgery, tell your doctor or health care professional that you have taken this medicine within the last twelve months. Ask your doctor or health care professional about your diet. You may need to lower the amount of salt you eat. This medicine may affect blood sugar levels. If you have diabetes, check with your doctor or health care professional before you change your diet or the dose of your diabetic medicine. What side effects may I notice from receiving this medicine? Side effects that you should report to your doctor or health care professional as soon as possible: -allergic reactions like skin rash, itching or hives, swelling of the face, lips, or tongue -changes in emotions or moods -changes in vision -depressed mood -eye pain -fever or chills, cough, sore throat, pain or difficulty passing urine -increased thirst -swelling of ankles, feet Side effects that usually do not require medical attention (report to your doctor or health care professional if they continue or are bothersome): -confusion, excitement, restlessness -headache -nausea, vomiting -skin problems, acne, thin and shiny skin -trouble sleeping -weight gain This list may not describe all possible side effects. Call your doctor for medical advice about side effects. You may report side effects to FDA at 1-800-FDA-1088. Where should I keep my medicine? Keep out of the reach of children. Store at room temperature between 15 and 30 degrees C (59 and 86 degrees F). Protect from light. Keep container tightly closed. Throw away any unused medicine after the  expiration date. NOTE: This sheet is a summary. It may not cover all possible information. If you have questions about this medicine, talk to your doctor, pharmacist, or health care provider.  2015, [employer] Standard. (2010-09-06 10:57:14)

## 2014-03-14 NOTE — Telephone Encounter (Signed)
Noted. appt scheduled

## 2014-03-14 NOTE — Progress Notes (Signed)
Pre visit review using our clinic review tool, if applicable. No additional management support is needed unless otherwise documented below in the visit note. 

## 2014-03-17 ENCOUNTER — Other Ambulatory Visit: Payer: Self-pay | Admitting: Family Medicine

## 2014-03-17 NOTE — Telephone Encounter (Signed)
I sent script e-scribe and spoke with pt. 

## 2014-03-17 NOTE — Telephone Encounter (Signed)
Call in another Zpack  

## 2014-03-17 NOTE — Telephone Encounter (Signed)
Per Dr. Sarajane Jews, okay to refill.

## 2014-03-17 NOTE — Telephone Encounter (Signed)
Pt states her son now has the same thing as she has. pt still has same symptoms as on 2/2 and the steroids dr Maudie Mercury gave her are not working. Pt's son was prescribed a 2/ z paks and was advised by pharmacist she would need the same. Pt is requesting another z pak.  Pt is going on vacation next week and really needs to get well. [facility] [address]

## 2014-03-18 ENCOUNTER — Telehealth: Payer: Self-pay | Admitting: Family Medicine

## 2014-03-18 ENCOUNTER — Ambulatory Visit (INDEPENDENT_AMBULATORY_CARE_PROVIDER_SITE_OTHER): Payer: BC Managed Care – PPO | Admitting: Family Medicine

## 2014-03-18 ENCOUNTER — Ambulatory Visit (INDEPENDENT_AMBULATORY_CARE_PROVIDER_SITE_OTHER)
Admission: RE | Admit: 2014-03-18 | Discharge: 2014-03-18 | Disposition: A | Discharge status: Home / Self Care | Payer: BC Managed Care – PPO | Source: Ambulatory Visit | Attending: Family Medicine | Admitting: Family Medicine

## 2014-03-18 ENCOUNTER — Encounter: Payer: Self-pay | Admitting: Family Medicine

## 2014-03-18 VITALS — BP 115/66 | HR 74 | Temp 98.9°F | Ht 63.75 in | Wt 112.0 lb

## 2014-03-18 DIAGNOSIS — R05 Cough: Secondary | ICD-10-CM

## 2014-03-18 DIAGNOSIS — J209 Acute bronchitis, unspecified: Secondary | ICD-10-CM

## 2014-03-18 DIAGNOSIS — R059 Cough, unspecified: Secondary | ICD-10-CM

## 2014-03-18 MED ORDER — FLUCONAZOLE 150 MG PO TABS: 150.0000 mg | ORAL_TABLET | ORAL | Status: DC | PRN

## 2014-03-18 MED ORDER — LEVOFLOXACIN 500 MG PO TABS: 500.0000 mg | ORAL_TABLET | Freq: Every day | ORAL | Status: AC

## 2014-03-18 MED ORDER — ALBUTEROL SULFATE HFA 108 (90 BASE) MCG/ACT IN AERS: 2.0000 | INHALATION_SPRAY | RESPIRATORY_TRACT | Status: DC | PRN

## 2014-03-18 MED ORDER — PREDNISONE 10 MG PO TABS: ORAL_TABLET | ORAL | Status: DC

## 2014-03-18 NOTE — Telephone Encounter (Signed)
I spoke with pt and she will get xray done this morning and schedule to see Dr. Sarajane Jews today.

## 2014-03-18 NOTE — Telephone Encounter (Signed)
appt scheduled

## 2014-03-18 NOTE — Progress Notes (Signed)
Pre visit review using our clinic review tool, if applicable. No additional management support is needed unless otherwise documented below in the visit note. 

## 2014-03-18 NOTE — Telephone Encounter (Signed)
I ordered a CXR so she can get this taken this morning, then have her see me OV this afternoon

## 2014-03-18 NOTE — Telephone Encounter (Signed)
Having SOB since 4am, and the zpack is not helping, her ribs hurt, having rattling in her chest and wants to proceed with a chest xray this morning.

## 2014-03-21 ENCOUNTER — Encounter: Payer: Self-pay | Admitting: Family Medicine

## 2014-03-21 NOTE — Progress Notes (Signed)
   Subjective:    Patient ID: Tricia Rodriguez, female    DOB: Dec 18, 1970, 44 y.o.   MRN: [id number]  HPI Here to follow up on chest congestion and coughing. She was seen on 03-08-14 and was given a Zpack. Then she returned on 03-14-14 and was given a few days of prednisone. This helped as far as the congestion goes but the cough remains. This is a dry cough and she has some occasional wheezing. No fever. She had a CXR early this morning which was negative.    Review of Systems  Constitutional: Negative.   HENT: Positive for congestion. Negative for postnasal drip and sinus pressure.   Eyes: Negative.   Respiratory: Positive for cough and wheezing. Negative for shortness of breath.   Cardiovascular: Negative.        Objective:   Physical Exam  Constitutional: She appears well-developed and well-nourished.  HENT:  Right Ear: External ear normal.  Left Ear: External ear normal.  Nose: Nose normal.  Mouth/Throat: Oropharynx is clear and moist.  Eyes: Conjunctivae are normal.  Pulmonary/Chest: Effort normal. No respiratory distress. She has no wheezes. She has no rales.  Scattered rhonchi  Lymphadenopathy:    She has no cervical adenopathy.          Assessment & Plan:  She has a partially treated bronchitis. We will give her a course of Levaquin, some more prednisone, and an albuterol inhaler to use prn. Recheck prn

## 2014-08-26 ENCOUNTER — Other Ambulatory Visit (INDEPENDENT_AMBULATORY_CARE_PROVIDER_SITE_OTHER): Payer: BC Managed Care – PPO

## 2014-08-26 DIAGNOSIS — Z0000 Encounter for general adult medical examination without abnormal findings: Secondary | ICD-10-CM | POA: Diagnosis not present

## 2014-08-26 LAB — CBC WITH DIFFERENTIAL/PLATELET
Basophils Absolute: 0 10*3/uL (ref 0.0–0.1)
Basophils Relative: 0.5 % (ref 0.0–3.0)
Eosinophils Absolute: 0 10*3/uL (ref 0.0–0.7)
Eosinophils Relative: 0.4 % (ref 0.0–5.0)
HCT: 34.9 % — ABNORMAL LOW (ref 36.0–46.0)
Hemoglobin: 11 g/dL — ABNORMAL LOW (ref 12.0–15.0)
Lymphocytes Relative: 21.2 % (ref 12.0–46.0)
Lymphs Abs: 1.4 10*3/uL (ref 0.7–4.0)
MCHC: 31.5 g/dL (ref 30.0–36.0)
MCV: 77.3 fl — ABNORMAL LOW (ref 78.0–100.0)
MONO ABS: 0.6 10*3/uL (ref 0.1–1.0)
Monocytes Relative: 9.7 % (ref 3.0–12.0)
NEUTROS ABS: 4.4 10*3/uL (ref 1.4–7.7)
Neutrophils Relative %: 68.2 % (ref 43.0–77.0)
PLATELETS: 222 10*3/uL (ref 150.0–400.0)
RBC: 4.51 Mil/uL (ref 3.87–5.11)
RDW: 18 % — ABNORMAL HIGH (ref 11.5–15.5)
WBC: 6.4 10*3/uL (ref 4.0–10.5)

## 2014-08-26 LAB — LIPID PANEL
CHOL/HDL RATIO: 2
CHOLESTEROL: 154 mg/dL (ref 0–200)
HDL: 76.9 mg/dL (ref >39.00)
LDL Cholesterol: 66 mg/dL (ref 0–99)
NONHDL: 77.1
Triglycerides: 57 mg/dL (ref 0.0–149.0)
VLDL: 11.4 mg/dL (ref 0.0–40.0)

## 2014-08-26 LAB — POCT URINALYSIS DIPSTICK
Bilirubin, UA: NEGATIVE
Glucose, UA: NEGATIVE
KETONES UA: NEGATIVE
Leukocytes, UA: NEGATIVE
Nitrite, UA: NEGATIVE
PH UA: 5.5
Protein, UA: NEGATIVE
RBC UA: NEGATIVE
Urobilinogen, UA: 0.2

## 2014-08-26 LAB — BASIC METABOLIC PANEL
BUN: 11 mg/dL (ref 6–23)
CALCIUM: 9.1 mg/dL (ref 8.4–10.5)
CO2: 24 meq/L (ref 19–32)
Chloride: 109 mEq/L (ref 96–112)
Creatinine, Ser: 0.69 mg/dL (ref 0.40–1.20)
GFR: 97.14 mL/min (ref >60.00)
GLUCOSE: 87 mg/dL (ref 70–99)
Potassium: 5 mEq/L (ref 3.5–5.1)
Sodium: 139 mEq/L (ref 135–145)

## 2014-08-26 LAB — TSH: TSH: 0.76 u[IU]/mL (ref 0.35–4.50)

## 2014-08-26 LAB — HEPATIC FUNCTION PANEL
ALK PHOS: 52 U/L (ref 39–117)
ALT: 12 U/L (ref 0–35)
AST: 19 U/L (ref 0–37)
Albumin: 4.3 g/dL (ref 3.5–5.2)
BILIRUBIN DIRECT: 0.1 mg/dL (ref 0.0–0.3)
Total Bilirubin: 0.5 mg/dL (ref 0.2–1.2)
Total Protein: 6.9 g/dL (ref 6.0–8.3)

## 2014-08-26 LAB — FERRITIN: Ferritin: 3.3 ng/mL — ABNORMAL LOW (ref 10.0–291.0)

## 2014-08-26 NOTE — Addendum Note (Signed)
Addended by: Elmer Picker on: 08/26/2014 09:57 AM   Modules accepted: Orders

## 2014-09-07 ENCOUNTER — Ambulatory Visit (INDEPENDENT_AMBULATORY_CARE_PROVIDER_SITE_OTHER): Payer: BC Managed Care – PPO | Admitting: Family Medicine

## 2014-09-07 ENCOUNTER — Encounter: Payer: Self-pay | Admitting: Family Medicine

## 2014-09-07 VITALS — BP 108/69 | HR 64 | Temp 98.7°F | Ht 63.75 in | Wt 115.0 lb

## 2014-09-07 DIAGNOSIS — Z0000 Encounter for general adult medical examination without abnormal findings: Secondary | ICD-10-CM

## 2014-09-07 DIAGNOSIS — D509 Iron deficiency anemia, unspecified: Secondary | ICD-10-CM

## 2014-09-07 MED ORDER — FERROUS SULFATE 324 (65 FE) MG PO TBEC: 1.0000 | DELAYED_RELEASE_TABLET | Freq: Every day | ORAL | Status: DC

## 2014-09-07 NOTE — Progress Notes (Signed)
   Subjective:    Patient ID: Tricia Rodriguez, female    DOB: 09/26/1970, 44 y.o.   MRN: [id number]  HPI 44 yr old female for a cpx. She feels well and has no concerns. She works out regularly.    Review of Systems  Constitutional: Negative.   HENT: Negative.   Eyes: Negative.   Respiratory: Negative.   Cardiovascular: Negative.   Gastrointestinal: Negative.   Genitourinary: Negative for dysuria, urgency, frequency, hematuria, flank pain, decreased urine volume, enuresis, difficulty urinating, pelvic pain and dyspareunia.  Musculoskeletal: Negative.   Skin: Negative.   Neurological: Negative.   Psychiatric/Behavioral: Negative.        Objective:   Physical Exam  Constitutional: She is oriented to person, place, and time. She appears well-developed and well-nourished. No distress.  HENT:  Head: Normocephalic and atraumatic.  Right Ear: External ear normal.  Left Ear: External ear normal.  Nose: Nose normal.  Mouth/Throat: Oropharynx is clear and moist. No oropharyngeal exudate.  Eyes: Conjunctivae and EOM are normal. Pupils are equal, round, and reactive to light. No scleral icterus.  Neck: Normal range of motion. Neck supple. No JVD present. No thyromegaly present.  Cardiovascular: Normal rate, regular rhythm, normal heart sounds and intact distal pulses.  Exam reveals no gallop and no friction rub.   No murmur heard. Pulmonary/Chest: Effort normal and breath sounds normal. No respiratory distress. She has no wheezes. She has no rales. She exhibits no tenderness.  Abdominal: Soft. Bowel sounds are normal. She exhibits no distension and no mass. There is no tenderness. There is no rebound and no guarding.  Musculoskeletal: Normal range of motion. She exhibits no edema or tenderness.  Lymphadenopathy:    She has no cervical adenopathy.  Neurological: She is alert and oriented to person, place, and time. She has normal reflexes. No cranial nerve deficit. She exhibits normal muscle  tone. Coordination normal.  Skin: Skin is warm and dry. No rash noted. No erythema.  Psychiatric: She has a normal mood and affect. Her behavior is normal. Judgment and thought content normal.          Assessment & Plan:  Well exam. We discussed diet and exercise advice. She will start taking an iron pill daily for the anemia.

## 2014-09-07 NOTE — Progress Notes (Signed)
Pre visit review using our clinic review tool, if applicable. No additional management support is needed unless otherwise documented below in the visit note. 

## 2015-01-06 ENCOUNTER — Other Ambulatory Visit: Payer: Self-pay | Admitting: Obstetrics and Gynecology

## 2015-01-18 ENCOUNTER — Encounter (HOSPITAL_BASED_OUTPATIENT_CLINIC_OR_DEPARTMENT_OTHER): Payer: Self-pay | Admitting: *Deleted

## 2015-01-19 ENCOUNTER — Encounter (HOSPITAL_BASED_OUTPATIENT_CLINIC_OR_DEPARTMENT_OTHER): Payer: Self-pay | Admitting: *Deleted

## 2015-01-19 NOTE — Progress Notes (Signed)
NPO AFTER MN.  ARRIVE AT [id number]. GETTING LAB WORK DONE [facility] 01-20-2015 (CBC, BMET, URINE PREG).

## 2015-01-20 DIAGNOSIS — D509 Iron deficiency anemia, unspecified: Secondary | ICD-10-CM | POA: Diagnosis not present

## 2015-01-20 DIAGNOSIS — D259 Leiomyoma of uterus, unspecified: Secondary | ICD-10-CM | POA: Diagnosis not present

## 2015-01-20 DIAGNOSIS — N920 Excessive and frequent menstruation with regular cycle: Secondary | ICD-10-CM | POA: Diagnosis present

## 2015-01-20 LAB — BASIC METABOLIC PANEL
ANION GAP: 6 (ref 5–15)
BUN: 10 mg/dL (ref 6–20)
CHLORIDE: 108 mmol/L (ref 101–111)
CO2: 26 mmol/L (ref 22–32)
Calcium: 9 mg/dL (ref 8.9–10.3)
Creatinine, Ser: 0.71 mg/dL (ref 0.44–1.00)
GFR calc non Af Amer: >60 mL/min (ref >60)
GLUCOSE: 94 mg/dL (ref 65–99)
POTASSIUM: 4.5 mmol/L (ref 3.5–5.1)
Sodium: 140 mmol/L (ref 135–145)

## 2015-01-20 LAB — CBC
HCT: 33.3 % — ABNORMAL LOW (ref 36.0–46.0)
Hemoglobin: 10.6 g/dL — ABNORMAL LOW (ref 12.0–15.0)
MCH: 24.7 pg — ABNORMAL LOW (ref 26.0–34.0)
MCHC: 31.8 g/dL (ref 30.0–36.0)
MCV: 77.4 fL — AB (ref 78.0–100.0)
PLATELETS: 231 10*3/uL (ref 150–400)
RBC: 4.3 MIL/uL (ref 3.87–5.11)
RDW: 15.2 % (ref 11.5–15.5)
WBC: 6 10*3/uL (ref 4.0–10.5)

## 2015-01-23 ENCOUNTER — Ambulatory Visit (HOSPITAL_BASED_OUTPATIENT_CLINIC_OR_DEPARTMENT_OTHER): Payer: BC Managed Care – PPO | Admitting: Anesthesiology

## 2015-01-23 ENCOUNTER — Encounter (HOSPITAL_BASED_OUTPATIENT_CLINIC_OR_DEPARTMENT_OTHER): Payer: Self-pay

## 2015-01-23 ENCOUNTER — Ambulatory Visit (HOSPITAL_BASED_OUTPATIENT_CLINIC_OR_DEPARTMENT_OTHER)
Admission: RE | Admit: 2015-01-23 | Discharge: 2015-01-23 | Disposition: A | Discharge status: Home / Self Care | Payer: BC Managed Care – PPO | Source: Ambulatory Visit | Attending: Obstetrics and Gynecology | Admitting: Obstetrics and Gynecology

## 2015-01-23 ENCOUNTER — Encounter (HOSPITAL_BASED_OUTPATIENT_CLINIC_OR_DEPARTMENT_OTHER)
Admission: RE | Disposition: A | Discharge status: Home / Self Care | Payer: Self-pay | Source: Ambulatory Visit | Attending: Obstetrics and Gynecology

## 2015-01-23 DIAGNOSIS — D259 Leiomyoma of uterus, unspecified: Secondary | ICD-10-CM | POA: Diagnosis not present

## 2015-01-23 DIAGNOSIS — D509 Iron deficiency anemia, unspecified: Secondary | ICD-10-CM | POA: Insufficient documentation

## 2015-01-23 DIAGNOSIS — N920 Excessive and frequent menstruation with regular cycle: Secondary | ICD-10-CM | POA: Insufficient documentation

## 2015-01-23 HISTORY — PX: DILITATION & CURRETTAGE/HYSTROSCOPY WITH NOVASURE ABLATION: SHX5568

## 2015-01-23 HISTORY — DX: Presence of spectacles and contact lenses: Z97.3

## 2015-01-23 HISTORY — DX: Excessive and frequent menstruation with regular cycle: N92.0

## 2015-01-23 HISTORY — DX: Nausea with vomiting, unspecified: R11.2

## 2015-01-23 HISTORY — DX: Other specified postprocedural states: Z98.890

## 2015-01-23 HISTORY — DX: Leiomyoma of uterus, unspecified: D25.9

## 2015-01-23 LAB — POCT PREGNANCY, URINE: Preg Test, Ur: NEGATIVE

## 2015-01-23 SURGERY — DILATATION & CURETTAGE/HYSTEROSCOPY WITH NOVASURE ABLATION
Anesthesia: General | Site: Vagina

## 2015-01-23 MED ORDER — FENTANYL CITRATE (PF) 100 MCG/2ML IJ SOLN
INTRAMUSCULAR | Status: AC
  Filled 2015-01-23: qty 2

## 2015-01-23 MED ORDER — PROPOFOL 10 MG/ML IV BOLUS
INTRAVENOUS | Status: AC
  Filled 2015-01-23: qty 20

## 2015-01-23 MED ORDER — PROPOFOL 10 MG/ML IV BOLUS
INTRAVENOUS | Status: DC | PRN
  Administered 2015-01-23: 160 mg via INTRAVENOUS

## 2015-01-23 MED ORDER — LACTATED RINGERS IR SOLN
Status: DC | PRN
  Administered 2015-01-23: 3000 mL

## 2015-01-23 MED ORDER — MIDAZOLAM HCL 2 MG/2ML IJ SOLN
INTRAMUSCULAR | Status: AC
  Filled 2015-01-23: qty 2

## 2015-01-23 MED ORDER — ONDANSETRON HCL 4 MG/2ML IJ SOLN
INTRAMUSCULAR | Status: DC | PRN
  Administered 2015-01-23: 4 mg via INTRAVENOUS

## 2015-01-23 MED ORDER — LIDOCAINE HCL 1 % IJ SOLN
INTRAMUSCULAR | Status: DC | PRN
  Administered 2015-01-23: 20 mL

## 2015-01-23 MED ORDER — OXYCODONE-ACETAMINOPHEN 5-325 MG PO TABS
1.0000 | ORAL_TABLET | Freq: Once | ORAL | Status: AC
  Administered 2015-01-23: 1 via ORAL
  Filled 2015-01-23: qty 1

## 2015-01-23 MED ORDER — DEXAMETHASONE SODIUM PHOSPHATE 10 MG/ML IJ SOLN
INTRAMUSCULAR | Status: DC | PRN
  Administered 2015-01-23: 10 mg via INTRAVENOUS

## 2015-01-23 MED ORDER — KETOROLAC TROMETHAMINE 30 MG/ML IJ SOLN
INTRAMUSCULAR | Status: DC | PRN
  Administered 2015-01-23: 30 mg via INTRAVENOUS

## 2015-01-23 MED ORDER — GENTAMICIN SULFATE 40 MG/ML IJ SOLN
INTRAVENOUS | Status: AC
  Administered 2015-01-23: 260 mL via INTRAVENOUS
  Filled 2015-01-23 (×2): qty 6.5

## 2015-01-23 MED ORDER — LACTATED RINGERS IV SOLN
INTRAVENOUS | Status: DC
  Administered 2015-01-23 (×2): via INTRAVENOUS
  Filled 2015-01-23: qty 1000

## 2015-01-23 MED ORDER — GLYCOPYRROLATE 0.2 MG/ML IJ SOLN
INTRAMUSCULAR | Status: DC | PRN
  Administered 2015-01-23: 0.2 mg via INTRAVENOUS

## 2015-01-23 MED ORDER — LIDOCAINE HCL (CARDIAC) 20 MG/ML IV SOLN
INTRAVENOUS | Status: DC | PRN
  Administered 2015-01-23: 60 mg via INTRAVENOUS

## 2015-01-23 MED ORDER — OXYCODONE-ACETAMINOPHEN 10-325 MG PO TABS: 1.0000 | ORAL_TABLET | ORAL | Status: DC | PRN

## 2015-01-23 MED ORDER — SODIUM CHLORIDE 0.9 % IR SOLN
Status: DC | PRN
  Administered 2015-01-23: 3000 mL

## 2015-01-23 MED ORDER — KETOROLAC TROMETHAMINE 30 MG/ML IJ SOLN
INTRAMUSCULAR | Status: AC
  Filled 2015-01-23: qty 1

## 2015-01-23 MED ORDER — OXYCODONE-ACETAMINOPHEN 5-325 MG PO TABS
ORAL_TABLET | ORAL | Status: AC
  Filled 2015-01-23: qty 1

## 2015-01-23 MED ORDER — LIDOCAINE HCL 1 % IJ SOLN
INTRAMUSCULAR | Status: AC
  Filled 2015-01-23: qty 20

## 2015-01-23 MED ORDER — GLYCINE 1.5 % IR SOLN
Status: DC | PRN
  Administered 2015-01-23 (×2): 3000 mL
  Administered 2015-01-23: 6000 mL

## 2015-01-23 MED ORDER — FENTANYL CITRATE (PF) 100 MCG/2ML IJ SOLN
25.0000 ug | INTRAMUSCULAR | Status: DC | PRN
  Filled 2015-01-23: qty 1

## 2015-01-23 MED ORDER — MIDAZOLAM HCL 5 MG/5ML IJ SOLN
INTRAMUSCULAR | Status: DC | PRN
  Administered 2015-01-23: 2 mg via INTRAVENOUS

## 2015-01-23 MED ORDER — LACTATED RINGERS IV SOLN
INTRAVENOUS | Status: DC
  Filled 2015-01-23: qty 1000

## 2015-01-23 MED ORDER — LIDOCAINE HCL (CARDIAC) 20 MG/ML IV SOLN
INTRAVENOUS | Status: AC
  Filled 2015-01-23: qty 5

## 2015-01-23 MED ORDER — FENTANYL CITRATE (PF) 100 MCG/2ML IJ SOLN
INTRAMUSCULAR | Status: DC | PRN
  Administered 2015-01-23 (×3): 25 ug via INTRAVENOUS
  Administered 2015-01-23: 12.5 ug via INTRAVENOUS
  Administered 2015-01-23: 25 ug via INTRAVENOUS
  Administered 2015-01-23: 12.5 ug via INTRAVENOUS
  Administered 2015-01-23: 25 ug via INTRAVENOUS
  Administered 2015-01-23: 12.5 ug via INTRAVENOUS
  Administered 2015-01-23: 25 ug via INTRAVENOUS
  Administered 2015-01-23: 12.5 ug via INTRAVENOUS

## 2015-01-23 MED ORDER — ONDANSETRON HCL 4 MG/2ML IJ SOLN
INTRAMUSCULAR | Status: AC
  Filled 2015-01-23: qty 2

## 2015-01-23 MED ORDER — DEXAMETHASONE SODIUM PHOSPHATE 10 MG/ML IJ SOLN
INTRAMUSCULAR | Status: AC
  Filled 2015-01-23: qty 1

## 2015-01-23 MED ORDER — GLYCOPYRROLATE 0.2 MG/ML IJ SOLN
INTRAMUSCULAR | Status: AC
  Filled 2015-01-23: qty 1

## 2015-01-23 SURGICAL SUPPLY — 38 items
ABLATOR ENDOMETRIAL BIPOLAR (ABLATOR) ×3 IMPLANT
CANISTER SUCTION 2500CC (MISCELLANEOUS) ×4 IMPLANT
CATH ROBINSON RED A/P 16FR (CATHETERS) ×4 IMPLANT
COVER BACK TABLE 60X90IN (DRAPES) ×4 IMPLANT
CYLINDER CO2 NOVASURE GENER (MISCELLANEOUS) ×1 IMPLANT
DRAPE HYSTEROSCOPY (DRAPE) ×4 IMPLANT
DRAPE LG THREE QUARTER DISP (DRAPES) ×4 IMPLANT
DRSG TELFA 3X8 NADH (GAUZE/BANDAGES/DRESSINGS) ×4 IMPLANT
ELECT REM PT RETURN 9FT ADLT (ELECTROSURGICAL) ×8
ELECTRODE REM PT RTRN 9FT ADLT (ELECTROSURGICAL) ×4 IMPLANT
ELECTRODE ROLLER BARREL 22FR (ELECTROSURGICAL) IMPLANT
GLOVE BIO SURGEON STRL SZ7 (GLOVE) ×4 IMPLANT
GLOVE ECLIPSE 7.0 STRL STRAW (GLOVE) ×4 IMPLANT
GLOVE SURG SS PI 7.5 STRL IVOR (GLOVE) ×6 IMPLANT
GLYCINE 1.5% IRRIG UROMATIC (IV SOLUTION) ×12 IMPLANT
GOWN STRL REUS W/ TWL LRG LVL3 (GOWN DISPOSABLE) ×3 IMPLANT
GOWN STRL REUS W/ TWL XL LVL3 (GOWN DISPOSABLE) ×2 IMPLANT
GOWN STRL REUS W/TWL LRG LVL3 (GOWN DISPOSABLE) ×8
GOWN STRL REUS W/TWL XL LVL3 (GOWN DISPOSABLE) ×4
KIT ROOM TURNOVER WOR (KITS) ×4 IMPLANT
LEGGING LITHOTOMY PAIR STRL (DRAPES) ×4 IMPLANT
LOOP ANGLED CUTTING 22FR (CUTTING LOOP) ×3 IMPLANT
MANIFOLD NEPTUNE II (INSTRUMENTS) IMPLANT
NDL SPNL 22GX3.5 QUINCKE BK (NEEDLE) ×1 IMPLANT
NEEDLE SPNL 22GX3.5 QUINCKE BK (NEEDLE) ×4 IMPLANT
NOVASURE GENER CO2 CL (MISCELLANEOUS) ×4
PACK BASIN DAY SURGERY FS (CUSTOM PROCEDURE TRAY) ×4 IMPLANT
PAD DRESSING TELFA 3X8 NADH (GAUZE/BANDAGES/DRESSINGS) ×1 IMPLANT
PAD OB MATERNITY 4.3X12.25 (PERSONAL CARE ITEMS) ×4 IMPLANT
PAD PREP 24X48 CUFFED NSTRL (MISCELLANEOUS) ×4 IMPLANT
SYR CONTROL 10ML LL (SYRINGE) ×4 IMPLANT
TOWEL OR 17X24 6PK STRL BLUE (TOWEL DISPOSABLE) ×8 IMPLANT
TRAY DSU PREP LF (CUSTOM PROCEDURE TRAY) ×4 IMPLANT
TUBE CONNECTING 12'X1/4 (SUCTIONS) ×1
TUBE CONNECTING 12X1/4 (SUCTIONS) ×2 IMPLANT
TUBING AQUILEX INFLOW (TUBING) ×4 IMPLANT
TUBING AQUILEX OUTFLOW (TUBING) ×4 IMPLANT
WATER STERILE IRR 500ML POUR (IV SOLUTION) ×4 IMPLANT

## 2015-01-23 NOTE — H&P (Signed)
Tricia Rodriguez is an 44 y.o. white female who presents to the OR for a hysteroscopy and ablation for menorrhagia. She has known fibroids but none appear to be submucosal.  Chief Complaint: HPI:  Past Medical History  Diagnosis Date  . Anemia   . Uterine fibroid   . Menorrhagia   . PONV (postoperative nausea and vomiting)   . Wears contact lenses     Past Surgical History  Procedure Laterality Date  . Knee surgery Bilateral 1980's  . Dilation and curettage of uterus  2000  . Wrist surgery  1999    Family History  Problem Relation Age of Onset  . Alcohol abuse Mother   . Arthritis Mother   . Hyperlipidemia Father   . Hypertension Father   . Diabetes Maternal Grandmother   . Alcohol abuse Maternal Grandfather   . Diabetes Paternal Grandmother    Social History:  reports that she has never smoked. She has never used smokeless tobacco. She reports that she drinks alcohol. She reports that she does not use illicit drugs.  Allergies:  Allergies  Allergen Reactions  . Penicillins Hives    Medications Prior to Admission  Medication Sig Dispense Refill  . ibuprofen (ADVIL,MOTRIN) 200 MG tablet Take 200 mg by mouth every 6 (six) hours as needed.         Blood pressure 126/60, pulse 81, temperature 98.4 F (36.9 C), temperature source Oral, resp. rate 10, height 5' 4.75" (1.645 m), weight 110 lb (49.896 kg), last menstrual period 12/28/2014, SpO2 100 %. General appearance: alert and cooperative Heart: regular rate and rhythm, S1, S2 normal, no murmur, click, rub or gallop Abdomen: soft, non-tender; bowel sounds normal; no masses,  no organomegaly   Lab Results  Component Value Date   WBC 6.0 01/20/2015   HGB 10.6* 01/20/2015   HCT 33.3* 01/20/2015   MCV 77.4* 01/20/2015   PLT 231 01/20/2015   Lab Results  Component Value Date   PREGTESTUR NEGATIVE 01/23/2015      Patient Active Problem List   Diagnosis Date Noted  . Anemia, iron deficiency 09/07/2014   IMP/  Menorrhagia Plan/ Proceed with hysteroscopy and ablation   ANDERSON,MARK E 01/23/2015, 11:30 AM

## 2015-01-23 NOTE — Anesthesia Procedure Notes (Signed)
Procedure Name: LMA Insertion Date/Time: 01/23/2015 12:38 PM Performed by: Justice Rocher Pre-anesthesia Checklist: Patient identified, Emergency Drugs available, Suction available and Patient being monitored Patient Re-evaluated:Patient Re-evaluated prior to inductionOxygen Delivery Method: Circle System Utilized Preoxygenation: Pre-oxygenation with 100% oxygen Intubation Type: IV induction Ventilation: Mask ventilation without difficulty LMA: LMA inserted LMA Size: 4.0 Number of attempts: 1 Airway Equipment and Method: Bite block Placement Confirmation: positive ETCO2 Tube secured with: Tape Dental Injury: Teeth and Oropharynx as per pre-operative assessment

## 2015-01-23 NOTE — Transfer of Care (Signed)
Immediate Anesthesia Transfer of Care Note  Patient: Tricia Rodriguez  Procedure(s) Performed: Procedure(s) (LRB): DILATATION & CURETTAGE/HYSTEROSCOPY WITH NOVASURE ABLATION POSSIBLE RESECTOSCOPE (N/A) POSSIBLE DILATATION & CURETTAGE/HYSTEROSCOPY WITH HYDROTHERMAL ABLATION (N/A)  Patient Location: PACU  Anesthesia Type: General  Level of Consciousness: awake, sedated, patient cooperative and responds to stimulation  Airway & Oxygen Therapy: Patient Spontanous Breathing and Patient connected to face mask oxygen  Post-op Assessment: Report given to PACU RN, Post -op Vital signs reviewed and stable and Patient moving all extremities  Post vital signs: Reviewed and stable  Complications: No apparent anesthesia complications

## 2015-01-23 NOTE — Anesthesia Preprocedure Evaluation (Signed)
Anesthesia Evaluation  Patient identified by MRN, date of birth, ID band Patient awake    Reviewed: Allergy & Precautions, H&P , NPO status , Patient's Chart, lab work & pertinent test results  History of Anesthesia Complications (+) PONV  Airway Mallampati: II  TM Distance: >3 FB Neck ROM: full    Dental no notable dental hx. (+) Dental Advisory Given, Teeth Intact   Pulmonary neg pulmonary ROS,    Pulmonary exam normal breath sounds clear to auscultation       Cardiovascular Exercise Tolerance: Good negative cardio ROS Normal cardiovascular exam Rhythm:regular Rate:Normal     Neuro/Psych negative neurological ROS  negative psych ROS   GI/Hepatic negative GI ROS, Neg liver ROS,   Endo/Other  negative endocrine ROS  Renal/GU negative Renal ROS  negative genitourinary   Musculoskeletal   Abdominal   Peds  Hematology negative hematology ROS (+)   Anesthesia Other Findings   Reproductive/Obstetrics negative OB ROS                             Anesthesia Physical Anesthesia Plan  ASA: II  Anesthesia Plan: General   Post-op Pain Management:    Induction: Intravenous  Airway Management Planned: LMA  Additional Equipment:   Intra-op Plan:   Post-operative Plan:   Informed Consent: I have reviewed the patients History and Physical, chart, labs and discussed the procedure including the risks, benefits and alternatives for the proposed anesthesia with the patient or authorized representative who has indicated his/her understanding and acceptance.   Dental Advisory Given  Plan Discussed with: CRNA and Surgeon  Anesthesia Plan Comments:         Anesthesia Quick Evaluation

## 2015-01-23 NOTE — Discharge Instructions (Signed)

## 2015-01-24 ENCOUNTER — Encounter (HOSPITAL_BASED_OUTPATIENT_CLINIC_OR_DEPARTMENT_OTHER): Payer: Self-pay | Admitting: Obstetrics and Gynecology

## 2015-01-24 NOTE — Anesthesia Postprocedure Evaluation (Signed)
Anesthesia Post Note  Patient: Tricia Rodriguez  Procedure(s) Performed: Procedure(s) (LRB): DILATATION & CURETTAGE/HYSTEROSCOPY WITH RESECTOSCOPE (N/A)  Patient location during evaluation: PACU Anesthesia Type: General Level of consciousness: awake and alert Pain management: pain level controlled Vital Signs Assessment: post-procedure vital signs reviewed and stable Respiratory status: spontaneous breathing, nonlabored ventilation, respiratory function stable and patient connected to nasal cannula oxygen Cardiovascular status: blood pressure returned to baseline and stable Postop Assessment: no signs of nausea or vomiting Anesthetic complications: no    Last Vitals:  Filed Vitals:   01/23/15 1415 01/23/15 1625  BP: 101/85 137/68  Pulse: 80 71  Temp:  36.4 C  Resp: 9 16    Last Pain:  Filed Vitals:   01/23/15 1626  PainSc: 2                  Alysandra Lobue L

## 2015-01-27 NOTE — Op Note (Signed)
NAMETEONNA, LUTFI NO.:  [id number]  MEDICAL RECORD NO.:  [id number]  LOCATION:                               FACILITY:  [facility]  PHYSICIAN:  Freda Munro, M.D.    DATE OF BIRTH:  02-20-1970  DATE OF PROCEDURE: DATE OF DISCHARGE:  01/23/2015                              OPERATIVE REPORT   PREOPERATIVE DIAGNOSES: 1. Menorrhagia. 2. Possible submucous fibroid.  POSTOPERATIVE DIAGNOSES: 1. Menorrhagia. 2. Possible submucous fibroid.  PROCEDURES: 1. Hysteroscopy. 2. Dilation and curettage. 3. Attempt at NovaSure endometrial ablation failure. 4. Uterine ablation via resectoscope.  SURGEON:  Freda Munro, M.D.  ANESTHESIA:  General and local.  ANTIBIOTICS:  Ancef 2 g.  DRAINS:  Red rubber catheter bladder.  SPECIMENS:  Endometrial curettings to pathology.  COMPLICATIONS:  None.  ESTIMATED BLOOD LOSS:  Minimal.  PROCEDURE IN DETAIL:  The patient was taken to the operating room, where general anesthetic was administered without difficulty.  She was then placed in a dorsal lithotomy position.  She was prepped and draped in usual fashion for this procedure.  An examination under anesthesia revealed a normal size and shaped uterus.  There are no adnexal masses. 20 mL of 1% lidocaine was used for paracervical block.  A single-tooth tenaculum was applied to the anterior cervical lip.  The cervix was then serially dilated to a 27-French.  Hysteroscope was advanced through the endocervical canal, which appeared to be normal.  Upon entering the uterine cavity, there were no evidence of any submucous fibroids or endometrial polyps.  Both ostia were visualized.  At this point, the hysteroscope was removed and sharp curettage was performed.  The cervix was then serially dilated to a 31-French and the NovaSure device was placed into the uterine cavity.  Both opened.  The uterine cavity length was 6 cm and the width was 4.5 cm.  Seal test was performed and  failed. At this point, a second tenaculum clamp was placed on the cervix to prevent any CO2 gas from escaping.  Another seal test was performed and failed.  Following this, the patient had Vaseline gauze placed around her os, again the procedure failed.  The NovaSure device was then removed.  Hysteroscope was placed looking for any evidence of any perforation, none was seen.  Following this, the device was placed back in second time.  The procedures were repeat and at all times failed. Following this, the second NovaSure device was placed, again similar procedures performed and procedure failed.  At this point, it was decided to proceed with resectoscope.  The single loop resectoscope was set up and resection of the uterine cavity with direct visualization was undertaken.  At the conclusion of the procedure, it appeared that there was significant ablation performed.  The patient tolerated the procedure well.  The fluid deficit was approximately 700 mL.  The patient was awoken and taken recovery room in stable condition.  Instrument count was correct x2. She will be discharged home.  She will follow up in the office in 4 weeks.  She was sent home with Percocet as needed.          ______________________________ Freda Munro,  M.D.     MA/MEDQ  D:  01/26/2015  T:  01/26/2015  Job:  [id number]

## 2015-02-08 ENCOUNTER — Other Ambulatory Visit: Payer: Self-pay

## 2015-02-08 DIAGNOSIS — Z1231 Encounter for screening mammogram for malignant neoplasm of breast: Secondary | ICD-10-CM

## 2015-03-10 ENCOUNTER — Ambulatory Visit
Admission: RE | Admit: 2015-03-10 | Discharge: 2015-03-10 | Disposition: A | Discharge status: Home / Self Care | Payer: BC Managed Care – PPO | Source: Ambulatory Visit

## 2015-03-10 DIAGNOSIS — Z1231 Encounter for screening mammogram for malignant neoplasm of breast: Secondary | ICD-10-CM

## 2015-09-27 ENCOUNTER — Telehealth: Payer: Self-pay | Admitting: Family Medicine

## 2015-09-27 NOTE — Telephone Encounter (Signed)
Pt would like an appointment Friday afternoon to address possible sinus infection. Pt states it has been progressing and her right nostril is now numb. Is it ok to schedule around 2pm friday afternoon?

## 2015-09-28 ENCOUNTER — Encounter: Payer: Self-pay | Admitting: Family Medicine

## 2015-09-28 ENCOUNTER — Ambulatory Visit (INDEPENDENT_AMBULATORY_CARE_PROVIDER_SITE_OTHER): Payer: BC Managed Care – PPO | Admitting: Family Medicine

## 2015-09-28 VITALS — BP 100/58 | HR 67 | Temp 98.9°F | Ht 64.75 in | Wt 115.3 lb

## 2015-09-28 DIAGNOSIS — J0100 Acute maxillary sinusitis, unspecified: Secondary | ICD-10-CM

## 2015-09-28 MED ORDER — DOXYCYCLINE HYCLATE 100 MG PO TABS: 100.0000 mg | ORAL_TABLET | Freq: Two times a day (BID) | ORAL | 0 refills | Status: DC

## 2015-09-28 NOTE — Telephone Encounter (Signed)
Actually I will be out tomorrow afternoon, but I can see her tomorrow morning

## 2015-09-28 NOTE — Telephone Encounter (Signed)
That is exactly why I asked!! Pt called back this am and was scheduled with Dr Maudie Mercury.  Cannot do Friday am Thank you!!

## 2015-09-28 NOTE — Progress Notes (Signed)
   HPI:  Acute visit for:  Sinus congestion -started: over 1 month ago -symptoms:nasal congestion, sore throat, cough - now worsening with R max sinus pressure and sometime pain, R nasal congestion that is discolored -denies:fever, SOB, NVD, tooth pain -has tried: OTC treatments -sick contacts/travel/risks: no reported flu, strep or tick exposure - daughter with sinus infection  ROS: See pertinent positives and negatives per HPI.  Past Medical History:  Diagnosis Date  . Anemia   . Menorrhagia   . PONV (postoperative nausea and vomiting)   . Uterine fibroid   . Wears contact lenses     Past Surgical History:  Procedure Laterality Date  . DILATION AND CURETTAGE OF UTERUS  2000  . DILITATION & CURRETTAGE/HYSTROSCOPY WITH NOVASURE ABLATION N/A 01/23/2015   Procedure: DILATATION & CURETTAGE/HYSTEROSCOPY WITH RESECTOSCOPE;  Surgeon: Olga Millers, MD;  Location: [facility];  Service: Gynecology;  Laterality: N/A;  . KNEE SURGERY Bilateral 1980's  . WRIST SURGERY  1999    Family History  Problem Relation Age of Onset  . Alcohol abuse Mother   . Arthritis Mother   . Hyperlipidemia Father   . Hypertension Father   . Diabetes Maternal Grandmother   . Alcohol abuse Maternal Grandfather   . Diabetes Paternal Grandmother     Social History   Social History  . Marital status: Single    Spouse name: N/A  . Number of children: N/A  . Years of education: N/A   Social History Main Topics  . Smoking status: Never Smoker  . Smokeless tobacco: Never Used  . Alcohol use 0.0 oz/week     Comment: occasional  . Drug use: No  . Sexual activity: Not Asked   Other Topics Concern  . None   Social History Narrative  . None     Current Outpatient Prescriptions:  .  doxycycline (VIBRA-TABS) 100 MG tablet, Take 1 tablet (100 mg total) by mouth 2 (two) times daily., Disp: 20 tablet, Rfl: 0  EXAM:  Vitals:   09/28/15 1036  BP: (!) 100/58  Pulse: 67  Temp:  98.9 F (37.2 C)    Body mass index is 19.34 kg/m.  GENERAL: vitals reviewed and listed above, alert, oriented, appears well hydrated and in no acute distress  HEENT: atraumatic, conjunttiva clear, no obvious abnormalities on inspection of external nose and ears, normal appearance of ear canals and TMs, thick nasal congestion R, mild post oropharyngeal erythema with PND, no tonsillar edema or exudate, no sinus TTP  NECK: no obvious masses on inspection  LUNGS: clear to auscultation bilaterally, no wheezes, rales or rhonchi, good air movement  CV: HRRR, no peripheral edema  MS: moves all extremities without noticeable abnormality  PSYCH: pleasant and cooperative, no obvious depression or anxiety  ASSESSMENT AND PLAN:  Discussed the following assessment and plan:  Acute maxillary sinusitis, recurrence not specified  -given HPI and exam findings today, a serious infection or illness is unlikely. We discussed potential etiologies, likely sinusitis. Opted to treat with doxy after discussion risks.  -of course, we advised to return or notify a doctor immediately if symptoms worsen or persist or new concerns arise.  Pt declined AVS.  There are no Patient Instructions on file for this visit.  Colin Benton R., DO

## 2015-09-28 NOTE — Progress Notes (Signed)
Pre visit review using our clinic review tool, if applicable. No additional management support is needed unless otherwise documented below in the visit note. 

## 2015-11-20 ENCOUNTER — Other Ambulatory Visit: Payer: Self-pay | Admitting: Obstetrics & Gynecology

## 2015-11-21 LAB — CYTOLOGY - PAP

## 2016-02-08 ENCOUNTER — Other Ambulatory Visit: Payer: Self-pay | Admitting: Obstetrics & Gynecology

## 2016-02-08 DIAGNOSIS — Z1231 Encounter for screening mammogram for malignant neoplasm of breast: Secondary | ICD-10-CM

## 2016-03-14 ENCOUNTER — Ambulatory Visit
Admission: RE | Admit: 2016-03-14 | Discharge: 2016-03-14 | Disposition: A | Discharge status: Home / Self Care | Payer: BC Managed Care – PPO | Source: Ambulatory Visit | Attending: Obstetrics & Gynecology | Admitting: Obstetrics & Gynecology

## 2016-03-14 DIAGNOSIS — Z1231 Encounter for screening mammogram for malignant neoplasm of breast: Secondary | ICD-10-CM

## 2016-04-29 ENCOUNTER — Encounter: Payer: Self-pay | Admitting: Family Medicine

## 2016-04-29 ENCOUNTER — Ambulatory Visit (INDEPENDENT_AMBULATORY_CARE_PROVIDER_SITE_OTHER): Payer: BC Managed Care – PPO | Admitting: Family Medicine

## 2016-04-29 VITALS — BP 130/66 | HR 81 | Temp 98.4°F | Ht 64.75 in | Wt 110.0 lb

## 2016-04-29 DIAGNOSIS — J209 Acute bronchitis, unspecified: Secondary | ICD-10-CM | POA: Diagnosis not present

## 2016-04-29 MED ORDER — HYDROCODONE-HOMATROPINE 5-1.5 MG/5ML PO SYRP: 5.0000 mL | ORAL_SOLUTION | ORAL | 0 refills | Status: DC | PRN

## 2016-04-29 MED ORDER — LEVOFLOXACIN 500 MG PO TABS: 500.0000 mg | ORAL_TABLET | Freq: Every day | ORAL | 0 refills | Status: AC

## 2016-04-29 NOTE — Patient Instructions (Signed)
WE NOW OFFER   [facility] FAST TRACK!!!  SAME DAY Appointments for ACUTE CARE  Such as: Sprains, Injuries, cuts, abrasions, rashes, muscle pain, joint pain, back pain Colds, flu, sore throats, headache, allergies, cough, fever  Ear pain, sinus and eye infections Abdominal pain, nausea, vomiting, diarrhea, upset stomach Animal/insect bites  3 Easy Ways to Schedule: Walk-In Scheduling Call in scheduling Mychart Sign-up: [email or web address]         

## 2016-04-29 NOTE — Progress Notes (Signed)
   Subjective:    Patient ID: Tricia Rodriguez, female    DOB: 08/31/1970, 46 y.o.   MRN: [id number]  HPI Here for 4 days of chest congestion, fever, and a dry cough.    Review of Systems  Constitutional: Positive for fever.  HENT: Negative.   Eyes: Negative.   Respiratory: Positive for cough and chest tightness. Negative for shortness of breath and wheezing.        Objective:   Physical Exam  Constitutional: She appears well-developed and well-nourished.  HENT:  Right Ear: External ear normal.  Left Ear: External ear normal.  Nose: Nose normal.  Mouth/Throat: Oropharynx is clear and moist.  Eyes: Conjunctivae are normal.  Neck: No thyromegaly present.  Pulmonary/Chest: Effort normal. No respiratory distress. She has no wheezes. She has no rales.  Her voice is hoarse. The lungs have scattered rhonchi   Lymphadenopathy:    She has no cervical adenopathy.          Assessment & Plan:  Bronchitis, treat with Levaquin. Add Mucinex prn.  Alysia Penna, MD

## 2016-04-29 NOTE — Progress Notes (Signed)
Pre visit review using our clinic review tool, if applicable. No additional management support is needed unless otherwise documented below in the visit note. 

## 2016-06-27 ENCOUNTER — Ambulatory Visit (INDEPENDENT_AMBULATORY_CARE_PROVIDER_SITE_OTHER): Payer: BC Managed Care – PPO | Admitting: Family Medicine

## 2016-06-27 ENCOUNTER — Encounter: Payer: Self-pay | Admitting: Family Medicine

## 2016-06-27 VITALS — BP 112/77 | HR 63 | Temp 98.2°F | Ht 64.75 in | Wt 106.0 lb

## 2016-06-27 DIAGNOSIS — Z0000 Encounter for general adult medical examination without abnormal findings: Secondary | ICD-10-CM

## 2016-06-27 LAB — POC URINALSYSI DIPSTICK (AUTOMATED)
Bilirubin, UA: NEGATIVE
Glucose, UA: NEGATIVE
Ketones, UA: NEGATIVE
Leukocytes, UA: NEGATIVE
Nitrite, UA: NEGATIVE
PH UA: 6 (ref 5.0–8.0)
PROTEIN UA: NEGATIVE
RBC UA: NEGATIVE
SPEC GRAV UA: 1.01 (ref 1.010–1.025)
UROBILINOGEN UA: 0.2 U/dL

## 2016-06-27 LAB — CBC WITH DIFFERENTIAL/PLATELET
BASOS ABS: 0 10*3/uL (ref 0.0–0.1)
Basophils Relative: 0.6 % (ref 0.0–3.0)
EOS ABS: 0 10*3/uL (ref 0.0–0.7)
EOS PCT: 0.8 % (ref 0.0–5.0)
HCT: 40.5 % (ref 36.0–46.0)
HEMOGLOBIN: 13.7 g/dL (ref 12.0–15.0)
LYMPHS ABS: 1.3 10*3/uL (ref 0.7–4.0)
Lymphocytes Relative: 32.2 % (ref 12.0–46.0)
MCHC: 33.8 g/dL (ref 30.0–36.0)
MCV: 92.8 fl (ref 78.0–100.0)
MONO ABS: 0.4 10*3/uL (ref 0.1–1.0)
Monocytes Relative: 10.1 % (ref 3.0–12.0)
NEUTROS PCT: 56.3 % (ref 43.0–77.0)
Neutro Abs: 2.3 10*3/uL (ref 1.4–7.7)
Platelets: 246 10*3/uL (ref 150.0–400.0)
RBC: 4.36 Mil/uL (ref 3.87–5.11)
RDW: 13.1 % (ref 11.5–15.5)
WBC: 4.1 10*3/uL (ref 4.0–10.5)

## 2016-06-27 LAB — BASIC METABOLIC PANEL
BUN: 10 mg/dL (ref 6–23)
CALCIUM: 9 mg/dL (ref 8.4–10.5)
CO2: 28 meq/L (ref 19–32)
CREATININE: 0.73 mg/dL (ref 0.40–1.20)
Chloride: 105 mEq/L (ref 96–112)
GFR: 90.31 mL/min (ref >60.00)
Glucose, Bld: 88 mg/dL (ref 70–99)
Potassium: 4.1 mEq/L (ref 3.5–5.1)
Sodium: 138 mEq/L (ref 135–145)

## 2016-06-27 LAB — HEPATIC FUNCTION PANEL
ALT: 12 U/L (ref 0–35)
AST: 20 U/L (ref 0–37)
Albumin: 4.5 g/dL (ref 3.5–5.2)
Alkaline Phosphatase: 46 U/L (ref 39–117)
BILIRUBIN DIRECT: 0.2 mg/dL (ref 0.0–0.3)
TOTAL PROTEIN: 6.6 g/dL (ref 6.0–8.3)
Total Bilirubin: 0.8 mg/dL (ref 0.2–1.2)

## 2016-06-27 LAB — LIPID PANEL
CHOL/HDL RATIO: 2
Cholesterol: 153 mg/dL (ref 0–200)
HDL: 71.7 mg/dL (ref >39.00)
LDL Cholesterol: 74 mg/dL (ref 0–99)
NONHDL: 81.01
Triglycerides: 34 mg/dL (ref 0.0–149.0)
VLDL: 6.8 mg/dL (ref 0.0–40.0)

## 2016-06-27 LAB — TSH: TSH: 1.09 u[IU]/mL (ref 0.35–4.50)

## 2016-06-27 NOTE — Progress Notes (Signed)
   Subjective:    Patient ID: Tricia Rodriguez, female    DOB: [birth year], 46 y.o.   MRN: [id number]  HPI 46 yr old female for a well exam. She feels good but asks me about an OTC supplement she wants to try for relieving anxiety and for increasing her focus. This is PharmaGABA, a GABA supplement. She says she has had toruble focusing all her life and it has been getting worse as she gets older. Her coworkers often comment on how she seems scattered at work and has trouble working on one task at a time. Her son is treated for ADHD, but she has never been diagnosed with this.    Review of Systems  Constitutional: Negative.   HENT: Negative.   Eyes: Negative.   Respiratory: Negative.   Cardiovascular: Negative.   Gastrointestinal: Negative.   Genitourinary: Negative for decreased urine volume, difficulty urinating, dyspareunia, dysuria, enuresis, flank pain, frequency, hematuria, pelvic pain and urgency.  Musculoskeletal: Negative.   Skin: Negative.   Neurological: Negative.   Psychiatric/Behavioral: Positive for decreased concentration. Negative for agitation, behavioral problems, confusion, dysphoric mood, hallucinations, self-injury, sleep disturbance and suicidal ideas. The patient is nervous/anxious. The patient is not hyperactive.        Objective:   Physical Exam  Constitutional: She is oriented to person, place, and time. She appears well-developed and well-nourished. No distress.  HENT:  Head: Normocephalic and atraumatic.  Right Ear: External ear normal.  Left Ear: External ear normal.  Nose: Nose normal.  Mouth/Throat: Oropharynx is clear and moist. No oropharyngeal exudate.  Eyes: Conjunctivae and EOM are normal. Pupils are equal, round, and reactive to light. No scleral icterus.  Neck: Normal range of motion. Neck supple. No JVD present. No thyromegaly present.  Cardiovascular: Normal rate, regular rhythm, normal heart sounds and intact distal pulses.  Exam reveals no gallop  and no friction rub.   No murmur heard. Pulmonary/Chest: Effort normal and breath sounds normal. No respiratory distress. She has no wheezes. She has no rales. She exhibits no tenderness.  Abdominal: Soft. Bowel sounds are normal. She exhibits no distension and no mass. There is no tenderness. There is no rebound and no guarding.  Musculoskeletal: Normal range of motion. She exhibits no edema or tenderness.  Lymphadenopathy:    She has no cervical adenopathy.  Neurological: She is alert and oriented to person, place, and time. She has normal reflexes. No cranial nerve deficit. She exhibits normal muscle tone. Coordination normal.  Skin: Skin is warm and dry. No rash noted. No erythema.  Psychiatric: She has a normal mood and affect. Her behavior is normal. Judgment and thought content normal.          Assessment & Plan:  Well exam. We discussed diet and exercise. Get fasting labs today. I told her it would be okay to try the GABA supplement. She will let us know if it helps.  Alysia Penna, MD

## 2016-06-27 NOTE — Patient Instructions (Signed)
WE NOW OFFER   [facility] FAST TRACK!!!  SAME DAY Appointments for ACUTE CARE  Such as: Sprains, Injuries, cuts, abrasions, rashes, muscle pain, joint pain, back pain Colds, flu, sore throats, headache, allergies, cough, fever  Ear pain, sinus and eye infections Abdominal pain, nausea, vomiting, diarrhea, upset stomach Animal/insect bites  3 Easy Ways to Schedule: Walk-In Scheduling Call in scheduling Mychart Sign-up: [email or web address]         

## 2016-07-02 ENCOUNTER — Other Ambulatory Visit: Payer: Self-pay

## 2016-07-02 ENCOUNTER — Encounter: Payer: Self-pay | Admitting: Family Medicine

## 2016-07-02 ENCOUNTER — Ambulatory Visit (INDEPENDENT_AMBULATORY_CARE_PROVIDER_SITE_OTHER): Payer: BC Managed Care – PPO | Admitting: Family Medicine

## 2016-07-02 VITALS — BP 110/78 | HR 66 | Temp 98.6°F | Ht 64.75 in | Wt 106.0 lb

## 2016-07-02 DIAGNOSIS — R109 Unspecified abdominal pain: Secondary | ICD-10-CM

## 2016-07-02 DIAGNOSIS — R1012 Left upper quadrant pain: Secondary | ICD-10-CM

## 2016-07-02 LAB — POC URINALSYSI DIPSTICK (AUTOMATED)
Bilirubin, UA: NEGATIVE
Blood, UA: NEGATIVE
GLUCOSE UA: NEGATIVE
Ketones, UA: NEGATIVE
LEUKOCYTES UA: NEGATIVE
Nitrite, UA: NEGATIVE
Protein, UA: NEGATIVE
Spec Grav, UA: 1.01 (ref 1.010–1.025)
UROBILINOGEN UA: 0.2 U/dL
pH, UA: 6 (ref 5.0–8.0)

## 2016-07-02 NOTE — Patient Instructions (Signed)
WE NOW OFFER   [facility] FAST TRACK!!!  SAME DAY Appointments for ACUTE CARE  Such as: Sprains, Injuries, cuts, abrasions, rashes, muscle pain, joint pain, back pain Colds, flu, sore throats, headache, allergies, cough, fever  Ear pain, sinus and eye infections Abdominal pain, nausea, vomiting, diarrhea, upset stomach Animal/insect bites  3 Easy Ways to Schedule: Walk-In Scheduling Call in scheduling Mychart Sign-up: [email or web address]         

## 2016-07-02 NOTE — Addendum Note (Signed)
Addended by: Alysia Penna A on: 07/02/2016 05:25 PM   Modules accepted: Orders

## 2016-07-02 NOTE — Progress Notes (Signed)
   Subjective:    Patient ID: Tricia Rodriguez, female    DOB: [birth year], 46 y.o.   MRN: [id number]  HPI Here for 10 days of LUQ abdominal pain. At first this was intermittent but is now continuous. It can be mild or severe at times. No nausea or vomiting. No fever. No change in urinations or BMs. Her UA today is clear. Her last menses was one week ago as expected.    Review of Systems  Constitutional: Negative.   Respiratory: Negative.   Cardiovascular: Negative.   Gastrointestinal: Positive for abdominal pain. Negative for abdominal distention, anal bleeding, blood in stool, constipation, diarrhea, nausea, rectal pain and vomiting.  Genitourinary: Negative.   Neurological: Negative.        Objective:   Physical Exam  Constitutional: She is oriented to person, place, and time. She appears well-developed and well-nourished. No distress.  Cardiovascular: Normal rate, regular rhythm, normal heart sounds and intact distal pulses.   Pulmonary/Chest: Effort normal and breath sounds normal. No respiratory distress. She has no wheezes. She has no rales.  Abdominal: Soft. Bowel sounds are normal. She exhibits no distension and no mass. There is no tenderness. There is no rebound and no guarding.  Neurological: She is alert and oriented to person, place, and time.          Assessment & Plan:  LUQ pain of uncertain etiology. I suggested she start taking Prilosec OTC for a total of 40 mg daily to cover for possible gastritis or ulcers. Set up a contrasted CT of the abdomen and pelvis soon.  Alysia Penna, MD

## 2016-07-04 ENCOUNTER — Inpatient Hospital Stay: Admission: RE | Admit: 2016-07-04 | Payer: BC Managed Care – PPO | Source: Ambulatory Visit

## 2016-07-10 ENCOUNTER — Other Ambulatory Visit: Payer: Self-pay | Admitting: Physician Assistant

## 2016-08-05 ENCOUNTER — Telehealth: Payer: Self-pay | Admitting: Family Medicine

## 2016-08-05 MED ORDER — AMPHETAMINE-DEXTROAMPHET ER 10 MG PO CP24: 10.0000 mg | ORAL_CAPSULE | Freq: Every day | ORAL | 0 refills | Status: DC

## 2016-08-05 NOTE — Telephone Encounter (Signed)
I am glad she is feeling better. I have cancelled the orders for the CT scan and th [place]. I wrote for her to try Adderall XR daily. If she takes all 30 of these she should schedule a follow up OV to check her BP and discuss its effects

## 2016-08-05 NOTE — Telephone Encounter (Signed)
Script is ready for pick up here at front office and I spoke with pt, also went over below advice.

## 2016-08-05 NOTE — Addendum Note (Signed)
Addended by: Alysia Penna A on: 08/05/2016 02:48 PM   Modules accepted: Orders

## 2016-08-05 NOTE — Telephone Encounter (Signed)
Pt was seen on 07-02-16 for abd pain. Pt is doing fine and would like to cancel ultrasound appt. Pt also would like new rx adderall

## 2016-08-09 ENCOUNTER — Telehealth: Payer: Self-pay

## 2016-08-09 NOTE — Telephone Encounter (Signed)
Received PA request for Adderall XR 10 mg capsule. PA submitted & is pending. Key:  [id number]

## 2016-08-12 NOTE — Telephone Encounter (Signed)
Pa approved, form faxed back to pharmacy. 

## 2016-09-03 ENCOUNTER — Ambulatory Visit (INDEPENDENT_AMBULATORY_CARE_PROVIDER_SITE_OTHER): Payer: BC Managed Care – PPO | Admitting: Family Medicine

## 2016-09-03 ENCOUNTER — Encounter: Payer: Self-pay | Admitting: Family Medicine

## 2016-09-03 VITALS — BP 99/65 | HR 72 | Temp 98.3°F | Ht 64.75 in | Wt 107.0 lb

## 2016-09-03 DIAGNOSIS — J209 Acute bronchitis, unspecified: Secondary | ICD-10-CM | POA: Diagnosis not present

## 2016-09-03 DIAGNOSIS — F900 Attention-deficit hyperactivity disorder, predominantly inattentive type: Secondary | ICD-10-CM | POA: Diagnosis not present

## 2016-09-03 MED ORDER — LEVOFLOXACIN 500 MG PO TABS: 500.0000 mg | ORAL_TABLET | Freq: Every day | ORAL | 0 refills | Status: AC

## 2016-09-03 MED ORDER — AMPHETAMINE-DEXTROAMPHET ER 10 MG PO CP24: 20.0000 mg | ORAL_CAPSULE | Freq: Every day | ORAL | 0 refills | Status: DC

## 2016-09-03 NOTE — Patient Instructions (Signed)
WE NOW OFFER   [facility] FAST TRACK!!!  SAME DAY Appointments for ACUTE CARE  Such as: Sprains, Injuries, cuts, abrasions, rashes, muscle pain, joint pain, back pain Colds, flu, sore throats, headache, allergies, cough, fever  Ear pain, sinus and eye infections Abdominal pain, nausea, vomiting, diarrhea, upset stomach Animal/insect bites  3 Easy Ways to Schedule: Walk-In Scheduling Call in scheduling Mychart Sign-up: [email or web address]         

## 2016-09-03 NOTE — Progress Notes (Signed)
   Subjective:    Patient ID: Tricia Rodriguez, female    DOB: April 09, 1970, 46 y.o.   MRN: [id number]  HPI Here for 2 things. First she has had 5 days of stuffy head, PND, chest congestion and coughing up yellow sputum. No fever. Using Mucinex. Second she has been taking Adderall XR 10 mg daily for ADHD and this has not helped very much. No side effects.    Review of Systems  Constitutional: Negative.   HENT: Positive for postnasal drip and sinus pressure. Negative for sinus pain and sore throat.   Eyes: Negative.   Respiratory: Positive for cough and chest tightness.   Psychiatric/Behavioral: Positive for decreased concentration. Negative for dysphoric mood. The patient is not nervous/anxious.        Objective:   Physical Exam  Constitutional: She is oriented to person, place, and time. She appears well-developed and well-nourished.  HENT:  Right Ear: External ear normal.  Left Ear: External ear normal.  Nose: Nose normal.  Mouth/Throat: Oropharynx is clear and moist.  Eyes: Conjunctivae are normal.  Neck: No thyromegaly present.  Pulmonary/Chest: Breath sounds normal. No respiratory distress. She has no rales.  Lymphadenopathy:    She has no cervical adenopathy.  Neurological: She is alert and oriented to person, place, and time.  Psychiatric: She has a normal mood and affect. Her behavior is normal. Thought content normal.          Assessment & Plan:  Bronchitis, treat with Levaquin. For the ADHD she will increase the dose of Adderall XR to 20 mg a day by taking 2 capsules of the 10 mg size. Report back to [place] in 2 weeks.  Alysia Penna, MD

## 2016-09-09 ENCOUNTER — Telehealth: Payer: Self-pay | Admitting: Family Medicine

## 2016-09-09 NOTE — Telephone Encounter (Signed)
Pt states she discussed a higher dose of her amphetamine-dextroamphetamine (ADDERALL XR) 10 MG 24 hr capsule  She did not pick up a Rx 7/31 for 10 mg. Pt states she needs the 20 mg anyway.

## 2016-09-10 MED ORDER — AMPHETAMINE-DEXTROAMPHET ER 20 MG PO CP24: 20.0000 mg | ORAL_CAPSULE | ORAL | 0 refills | Status: DC

## 2016-09-10 NOTE — Telephone Encounter (Signed)
done

## 2016-09-10 NOTE — Telephone Encounter (Signed)
Script is ready for pick up here at front office, tried to reach pt and no answer.  

## 2016-09-13 ENCOUNTER — Telehealth: Payer: Self-pay | Admitting: Family Medicine

## 2016-09-13 NOTE — Telephone Encounter (Signed)
Pending. Key: [id number]

## 2016-09-13 NOTE — Telephone Encounter (Signed)
Pt calling stating that the pharmacy state that she needs PA for her Adderall 20 MG  Pharm:  [facility]

## 2016-09-16 NOTE — Telephone Encounter (Signed)
Pa approved, form faxed back to pharmacy. 

## 2016-10-16 ENCOUNTER — Telehealth: Payer: Self-pay | Admitting: Family Medicine

## 2016-10-16 MED ORDER — AMPHETAMINE-DEXTROAMPHET ER 20 MG PO CP24
20.0000 mg | ORAL_CAPSULE | ORAL | 0 refills | Status: DC
Start: 2016-10-16 — End: 2016-10-16

## 2016-10-16 MED ORDER — AMPHETAMINE-DEXTROAMPHET ER 20 MG PO CP24: 20.0000 mg | ORAL_CAPSULE | ORAL | 0 refills | Status: DC

## 2016-10-16 NOTE — Telephone Encounter (Signed)
° ° ° °  Pt request refill of the following:   amphetamine-dextroamphetamine (ADDERALL XR) 20 MG 24 hr capsule   Phamacy:

## 2016-10-16 NOTE — Telephone Encounter (Signed)
Done

## 2016-10-17 NOTE — Telephone Encounter (Signed)
Script is ready for pick up here at front office and I spoke with pt.  

## 2016-10-21 ENCOUNTER — Ambulatory Visit (INDEPENDENT_AMBULATORY_CARE_PROVIDER_SITE_OTHER): Payer: BC Managed Care – PPO | Admitting: Sports Medicine

## 2016-10-21 VITALS — BP 128/72 | Ht 64.5 in | Wt 106.0 lb

## 2016-10-21 DIAGNOSIS — M25511 Pain in right shoulder: Secondary | ICD-10-CM

## 2016-10-21 DIAGNOSIS — G8929 Other chronic pain: Secondary | ICD-10-CM

## 2016-10-21 NOTE — Progress Notes (Signed)
Subjective:    Patient ID: Tricia Rodriguez, female    DOB: July 15, 1970, 46 y.o.   MRN: [id number]  Patient is a 46 year old female who presents to the clinic with the chief complaint of right shoulder pain. She states that her symptoms began in January with complete numbness of her entire right upper extremity and have persisted and now worsened since then. She denies any one incident that she can remember where she injured herself or had a trauma. It started off with numbness of the arm including all fingers. She went to see physical therapy and performed both neck and right shoulder physical therapy for this. She did cervical traction, stretching, and arm strengthening without any significant improvement in a total of 2 months. She then, went to a chiropractor who tried to give her an adjustment which did not work. She has had dry needling which did help improve her numbness however she is still having intermittent numbness with movements. She states her pain is located diffusely in the right shoulder and radiates down to her elbow. It is worse with trying to brush her hair or put on her heart monitor on her back. Nothing really seems to make her pain any better. Besides physical therapy, she has tried anti-inflammatories and the above treatments without any significant success. Other associated symptoms: Denies any neck pain. States she does have a numbness sensation starting at her shoulder and going down to her entire arm. She does have significant weakness in shoulder flexion and external rotation. She complains of a popping sensation in her shoulder and the feeling that her arm is heavy and dead. She is also having nighttime shoulder pain and difficulty maintaining sleep associated with this. 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 She is right-handed. Of note, she states she had  a right shoulder injury lifting weights approximately 20 years ago and has been having intermittent right shoulder pain since then however she did not have a workup at that time nor did she require surgery. 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 She also does have a follow-up appointment scheduled with orthopedics in a week and a half however due to her significant symptoms to be seen.      Review of Systems  Musculoskeletal: Positive for arthralgias (R shoulder, R elbow). Negative for back pain, gait problem, joint swelling, myalgias, neck pain and neck stiffness.  Skin: Negative for color change, rash and wound.  Neurological: Positive for weakness (R shoulder) and numbness (R shoulder and arm/hand).       Objective:   Physical Exam  Constitutional: She is oriented to person, place, and time. She appears well-developed and well-nourished. No distress.  Physically fit  Musculoskeletal:  Upon inspection of her right upper extremity: She does not have a significant abnormalities however she is holding her right elbow with her left arm. On palpation, she is tenderness in the bicipital groove on the right upper extremity as well as anterior shoulder and posterior shoulder in the supraspinatus fossa. She has exquisitely limited active range of motion in forward flexion to 90 and abduction to approximately 90. Passive flexion up to 170 as well as passive abduction is 150. External rotation active to the level of the hairline on the right and to the mid scapular region on the left. Internal rotation to her right hip on the  right and to her T8 region on the left. Passive external rotation is symmetrical. She has significant weakness secondary to pain and shoulder flexion, external rotation, and internal rotation. She has a positive Hawkins test, positive crossarm test, she is unable to perform a Neer's test, pain is elicited with O'Briens testing however the patient  cannot fully perform this, negative speeds and near his symptoms testing.   Negative testing of the right elbow with normal flexion and extension range of motion. +5 out of 5 in arm flexion and extension. Sensation is intact to light touch C5-T1 without reproduction of numbness. Please see DTRs below for further neurologic examination. Radial pulse +2 out of 4.  Neurological: She is alert and oriented to person, place, and time. She displays normal reflexes. She exhibits normal muscle tone. Coordination normal.  Upon testing of her DTRs, her C5 +1 out of 4 bilaterally, C6 +2 out of 4 bilaterally, C7 +1 out of 4 bilaterally, L4 +1/4 bilaterally.  No abnormalities upon inspection of her cervical thoracic or lumbar spine. No tenderness to palpation of her cervical thoracic or lumbar spine. She does have tight sternocleidomastoid muscles right greater than left. Patient has full range of motion of her cervical spine in rotation and side bending. She also can flex and extend at the neck without significant discomfort. Negative Spurling's test.  Nursing note and vitals reviewed.   Limited diagnostic ultrasound: Supraspinatus and subscapularis appear intact. Biceps tendon was visualized in short axis and long axis views in the bicipital groove without any evidence of tendinopathy. AC appeared normal without evidence of separation.      Assessment & Plan:  1. Chronic Right shoulder pain-Rule out rotator cuff tear versus adhesive capsulitis versus neuropathic pain  Given the patient's history, physical exam, and sonographic findings, it is still unclear why the patient has significant decreases in range of motion and is having significant pain. On the differential includes: Adhesive capsulitis, rotator cuff tear, labral tear, brachial plexopathy, or cervical spine pathology. We will start workup with an x-ray of the right shoulder. Furthermore, I will order an MRI of the right shoulder given the patient's  severity of her symptoms. Also to investigate the nerve function, I'm recommending an EMG study. I will call the patient with the results of the studies. She has already had 2 months of physical therapy and other conservative measures without any significant relief. She may continue to follow-up with orthopedics (Dr. Onnie Graham). I did offer prescribing medications to help with nighttime pain and the patient declined those medications. The patient is in agreement with this plan. She was given patient education prior to discharge as well as had her diagnostic imaging tests side up. I did advise her to avoid exercises that cause significant pain including overhead exercises like climbing ropes and pull-ups until further pathology can be diagnosed.

## 2016-10-21 NOTE — Patient Instructions (Addendum)
Shoulder Pain It was great to see you today in the office. We performed a limited ultrasound which did not show any significant rotator cuff pathology. We will order an xray of your shoulder and neck to start. Furthermore, an MRI of your right shoulder and an EMG will be ordered to visualize the structures better and see nerve function. You may continue to follow up with orthopedics. I will call you with the results of these tests and with further management based upon the findings. Many things can cause shoulder pain, including:  An injury.  Moving the arm in the same way again and again (overuse).  Joint pain (arthritis).  Follow these instructions at home: Take these actions to help with your pain:  Squeeze a soft ball or a foam pad as much as you can. This helps to prevent swelling. It also makes the arm stronger.  Take over-the-counter and prescription medicines only as told by your doctor.  If told, put ice on the area: ? Put ice in a plastic bag. ? Place a towel between your skin and the bag. ? Leave the ice on for 20 minutes, 2-3 times per day. Stop putting on ice if it does not help with the pain.  If you were given a shoulder sling or immobilizer: ? Wear it as told. ? Remove it to shower or bathe. ? Move your arm as little as possible. ? Keep your hand moving. This helps prevent swelling.  Contact a doctor if:  Your pain gets worse.  Medicine does not help your pain.  You have new pain in your arm, hand, or fingers. Get help right away if:  Your arm, hand, or fingers: ? Tingle. ? Are numb. ? Are swollen. ? Are painful. ? Turn white or blue. This information is not intended to replace advice given to you by your health care provider. Make sure you discuss any questions you have with your health care provider. Document Released: 07/10/2007 Document Revised: 09/17/2015 Document Reviewed: 05/16/2014 Elsevier Interactive Patient Education  [employer].

## 2016-10-22 ENCOUNTER — Ambulatory Visit
Admission: RE | Admit: 2016-10-22 | Discharge: 2016-10-22 | Disposition: A | Discharge status: Home / Self Care | Payer: BC Managed Care – PPO | Source: Ambulatory Visit | Attending: Sports Medicine | Admitting: Sports Medicine

## 2016-10-22 ENCOUNTER — Other Ambulatory Visit: Payer: Self-pay

## 2016-10-22 DIAGNOSIS — M25511 Pain in right shoulder: Principal | ICD-10-CM

## 2016-10-22 DIAGNOSIS — G8929 Other chronic pain: Secondary | ICD-10-CM

## 2016-10-23 ENCOUNTER — Telehealth: Payer: Self-pay | Admitting: Sports Medicine

## 2016-10-23 ENCOUNTER — Ambulatory Visit
Admission: RE | Admit: 2016-10-23 | Discharge: 2016-10-23 | Disposition: A | Discharge status: Home / Self Care | Payer: BC Managed Care – PPO | Source: Ambulatory Visit | Attending: Sports Medicine | Admitting: Sports Medicine

## 2016-10-23 DIAGNOSIS — G8929 Other chronic pain: Secondary | ICD-10-CM

## 2016-10-23 DIAGNOSIS — M25511 Pain in right shoulder: Principal | ICD-10-CM

## 2016-10-23 NOTE — Telephone Encounter (Signed)
X-rays of the right shoulder and neck are reviewed. X-rays are unremarkable. Neck shows some mild spurring at C5-C6. Patient will proceed with MRI of the right shoulder and EMG/nerve conduction study as ordered. Will follow-up with her via telephone with those results when available. Of note, patient was asking about possibly getting an MRI of her neck as well. I don't think her insurance will approve two MRIs at once. We will wait on the results of the shoulder MRI and nerve conduction study before ordering a cervical spine MR.

## 2016-10-24 ENCOUNTER — Telehealth: Payer: Self-pay | Admitting: Sports Medicine

## 2016-10-24 NOTE — Telephone Encounter (Signed)
I spoke with Tricia Rodriguez on the phone today after reviewing MRI findings of her right shoulder. She has moderate tendinosis and a small tear of the supraspinatus tendon. Possible small tear of the posterior labrum as well. She is still in quite a bit of pain. She has an appointment with Dr. Onnie Graham next week and I've encouraged her to see him as scheduled. I did initially order an EMG/nerve conduction study but I will leave it up to the discretion of Dr. Onnie Graham as to whether or not that should be canceled. Further workup and treatment will be per his discretion.

## 2016-10-31 ENCOUNTER — Other Ambulatory Visit: Payer: BC Managed Care – PPO

## 2016-11-04 HISTORY — PX: SHOULDER SURGERY: SHX246

## 2016-12-20 ENCOUNTER — Telehealth: Payer: Self-pay | Admitting: Family Medicine

## 2016-12-20 NOTE — Telephone Encounter (Signed)
Copied from [place] [id number]. Topic: General - Other >> Dec 20, 2016  9:56 AM Lennox Solders wrote:  pt needs new rx generic adderall  xr 20 mg

## 2016-12-23 MED ORDER — AMPHETAMINE-DEXTROAMPHET ER 20 MG PO CP24: 20.0000 mg | ORAL_CAPSULE | ORAL | 0 refills | Status: DC

## 2016-12-23 NOTE — Telephone Encounter (Signed)
Done

## 2016-12-23 NOTE — Telephone Encounter (Signed)
Called pt and left a VM that Rx is ready for pick up at our front office. Rx's placed up front for pt.

## 2016-12-24 ENCOUNTER — Telehealth: Payer: Self-pay | Admitting: Family Medicine

## 2016-12-24 NOTE — Telephone Encounter (Signed)
Yes, please tell the pharmacy it is ok to change the Dec rx to fill it now

## 2016-12-24 NOTE — Telephone Encounter (Signed)
Sent to PCP for approval.  

## 2016-12-24 NOTE — Telephone Encounter (Signed)
Copied from [place] [phone]. Topic: General - Other >> Dec 24, 2016  2:59 PM Scherrie Gerlach wrote: Reason for CRM: [facility] ot advise pt dropped off adderall rx but does not have one for Nov.   Scripts were only for Dec, Jan and feb. Would like to know if ok to change the Dec rx to fill for November, and pt can call the office in Dec for a new Rx

## 2016-12-24 NOTE — Telephone Encounter (Signed)
[facility]ed [facility] and spoke with the pharmacists gave them the OK to refill the december Rx today.

## 2017-02-12 ENCOUNTER — Other Ambulatory Visit: Payer: Self-pay | Admitting: Obstetrics & Gynecology

## 2017-02-12 DIAGNOSIS — Z1231 Encounter for screening mammogram for malignant neoplasm of breast: Secondary | ICD-10-CM

## 2017-03-17 ENCOUNTER — Ambulatory Visit: Payer: BC Managed Care – PPO

## 2017-04-04 ENCOUNTER — Ambulatory Visit
Admission: RE | Admit: 2017-04-04 | Discharge: 2017-04-04 | Disposition: A | Discharge status: Home / Self Care | Payer: BC Managed Care – PPO | Source: Ambulatory Visit | Attending: Obstetrics & Gynecology | Admitting: Obstetrics & Gynecology

## 2017-04-04 DIAGNOSIS — Z1231 Encounter for screening mammogram for malignant neoplasm of breast: Secondary | ICD-10-CM

## 2017-06-09 ENCOUNTER — Other Ambulatory Visit: Payer: Self-pay

## 2017-06-09 ENCOUNTER — Emergency Department (HOSPITAL_BASED_OUTPATIENT_CLINIC_OR_DEPARTMENT_OTHER)
Admission: EM | Admit: 2017-06-09 | Discharge: 2017-06-09 | Disposition: A | Discharge status: Home / Self Care | Payer: BC Managed Care – PPO | Attending: Emergency Medicine | Admitting: Emergency Medicine

## 2017-06-09 ENCOUNTER — Encounter (HOSPITAL_BASED_OUTPATIENT_CLINIC_OR_DEPARTMENT_OTHER): Payer: Self-pay | Admitting: Emergency Medicine

## 2017-06-09 DIAGNOSIS — Y999 Unspecified external cause status: Secondary | ICD-10-CM | POA: Insufficient documentation

## 2017-06-09 DIAGNOSIS — Y939 Activity, unspecified: Secondary | ICD-10-CM | POA: Diagnosis not present

## 2017-06-09 DIAGNOSIS — Z2914 Encounter for prophylactic rabies immune globin: Secondary | ICD-10-CM | POA: Insufficient documentation

## 2017-06-09 DIAGNOSIS — Z203 Contact with and (suspected) exposure to rabies: Secondary | ICD-10-CM | POA: Insufficient documentation

## 2017-06-09 DIAGNOSIS — Z23 Encounter for immunization: Secondary | ICD-10-CM | POA: Insufficient documentation

## 2017-06-09 MED ORDER — TETANUS-DIPHTH-ACELL PERTUSSIS 5-2.5-18.5 LF-MCG/0.5 IM SUSP
0.5000 mL | Freq: Once | INTRAMUSCULAR | Status: AC
  Administered 2017-06-09: 0.5 mL via INTRAMUSCULAR
  Filled 2017-06-09: qty 0.5

## 2017-06-09 MED ORDER — RABIES VACCINE, PCEC IM SUSR
1.0000 mL | Freq: Once | INTRAMUSCULAR | Status: AC
Start: 2017-06-09 — End: 2017-06-09
  Administered 2017-06-09: 1 mL via INTRAMUSCULAR
  Filled 2017-06-09: qty 1

## 2017-06-09 MED ORDER — RABIES IMMUNE GLOBULIN 150 UNIT/ML IM INJ
20.0000 [IU]/kg | INJECTION | Freq: Once | INTRAMUSCULAR | Status: AC
  Administered 2017-06-09: 975 [IU] via INTRAMUSCULAR
  Filled 2017-06-09: qty 8

## 2017-06-09 NOTE — ED Provider Notes (Signed)
Emergency Department Provider Note   I have reviewed the triage vital signs and the nursing notes.   HISTORY  Chief Complaint Rabies Injection (bat)   HPI Tricia Rodriguez is a 47 y.o. female significant past medical history the presents the emergency department today secondary to bad exposure.  Patient [place] and had multiple bouts and one landed on her neck for a few seconds at most.  She knocked it off but did not feel a bite or noticed any puncture wounds or irritation under her skin afterwards and this was a couple days ago.  However she did talk to her friend who is a physician told her she needed to come in for prophylaxis.  She has no neurologic symptoms.  No fevers, headache or weakness.  No associated neurologic or psychiatric problems. No other associated or modifying symptoms.    Past Medical History:  Diagnosis Date  . Anemia   . Menorrhagia   . PONV (postoperative nausea and vomiting)   . Uterine fibroid   . Wears contact lenses     Patient Active Problem List   Diagnosis Date Noted  . 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 Anemia, iron deficiency 09/07/2014    Past Surgical History:  Procedure Laterality Date  . DILATION AND CURETTAGE OF UTERUS  2000  . DILITATION & CURRETTAGE/HYSTROSCOPY WITH NOVASURE ABLATION N/A 01/23/2015   Procedure: DILATATION & CURETTAGE/HYSTEROSCOPY WITH RESECTOSCOPE;  Surgeon: Olga Millers, MD;  Location: [facility];  Service: Gynecology;  Laterality: N/A;  . KNEE SURGERY Bilateral 1980's  . SHOULDER SURGERY    . WRIST SURGERY  1999    Current Outpatient Rx  . Order #: [id number] Class: Print    Allergies Penicillins  Family History  Problem Relation Age of Onset  . Alcohol abuse Mother   . Arthritis Mother   . Hyperlipidemia Father   . Hypertension Father   . Diabetes Maternal Grandmother   . Alcohol abuse Maternal Grandfather    . Diabetes Paternal Grandmother     Social History Social History   Tobacco Use  . Smoking status: Never Smoker  . Smokeless tobacco: Never Used  Substance Use Topics  . Alcohol use: Yes    Alcohol/week: 0.0 oz    Comment: occasional  . Drug use: No    Review of Systems  All other systems negative except as documented in the HPI. All pertinent positives and negatives as reviewed in the HPI. ____________________________________________   PHYSICAL EXAM:  VITAL SIGNS: ED Triage Vitals  Enc Vitals Group     BP 06/09/17 0755 (!) 141/91     Pulse Rate 06/09/17 0755 78     Resp 06/09/17 0755 18     Temp 06/09/17 0755 98.7 F (37.1 C)     Temp Source 06/09/17 0755 Oral     SpO2 06/09/17 0755 100 %     Weight 06/09/17 0800 107 lb 9.4 oz (48.8 kg)     Height 06/09/17 0800 5\' 5"  (1.651 m)    Constitutional: Alert and oriented. Well appearing and in no acute distress. Eyes: Conjunctivae are normal. PERRL. EOMI. Head: Atraumatic. Nose: No congestion/rhinnorhea. Mouth/Throat: Mucous membranes are moist.  Oropharynx non-erythematous. Neck: No stridor.  No meningeal signs.   Cardiovascular: Normal rate, regular rhythm. Good peripheral circulation. Grossly normal heart sounds.   Respiratory: Normal respiratory effort.  No retractions. Lungs CTAB. Gastrointestinal: Soft and nontender. No distention.  Musculoskeletal: No lower extremity tenderness  nor edema. No gross deformities of extremities. Neurologic:  Normal speech and language. No gross focal neurologic deficits are appreciated.  Skin:  Skin is warm, dry and intact. No rash noted. No obvious bite or skin breakage where bat reportedly was.   ____________________________________________   INITIAL IMPRESSION / ASSESSMENT AND PLAN / ED COURSE  No signs of bite. But will prophylactically treat. No local site to inject and it has been over 2 days since exposure making that not useful. Will return days 3, 7, 14.      Pertinent labs & imaging results that were available during my care of the patient were reviewed by me and considered in my medical decision making (see chart for details).  ____________________________________________  FINAL CLINICAL IMPRESSION(S) / ED DIAGNOSES  Final diagnoses:  Need for post exposure prophylaxis for rabies     MEDICATIONS GIVEN DURING THIS VISIT:  Medications  Tdap (BOOSTRIX) injection 0.5 mL (has no administration in time range)  rabies vaccine (RABAVERT) injection 1 mL (has no administration in time range)     NEW OUTPATIENT MEDICATIONS STARTED DURING THIS VISIT:  New Prescriptions   No medications on file    Note:  This note was prepared with assistance of Dragon voice recognition software. Occasional wrong-word or sound-a-like substitutions may have occurred due to the inherent limitations of voice recognition software.   Aurorah Schlachter, Corene Cornea, MD 06/09/17 [phone]

## 2017-06-09 NOTE — ED Triage Notes (Signed)
Pt states she felt something on the left side of her neck, turned out to be a bat.  Here for rabies treatment.  No signs of redness, swelling or irritation to the left side of her neck.

## 2017-06-12 ENCOUNTER — Emergency Department (INDEPENDENT_AMBULATORY_CARE_PROVIDER_SITE_OTHER)
Admission: EM | Admit: 2017-06-12 | Discharge: 2017-06-12 | Disposition: A | Discharge status: Home / Self Care | Payer: BC Managed Care – PPO | Source: Home / Self Care

## 2017-06-12 DIAGNOSIS — Z299 Encounter for prophylactic measures, unspecified: Secondary | ICD-10-CM

## 2017-06-12 DIAGNOSIS — Z23 Encounter for immunization: Secondary | ICD-10-CM | POA: Diagnosis not present

## 2017-06-12 MED ORDER — RABIES VACCINE, PCEC IM SUSR
1.0000 mL | Freq: Once | INTRAMUSCULAR | Status: AC
  Administered 2017-06-12: 1 mL via INTRAMUSCULAR

## 2017-06-12 NOTE — ED Triage Notes (Signed)
Rabies injection 

## 2017-06-16 ENCOUNTER — Encounter: Payer: Self-pay | Admitting: Emergency Medicine

## 2017-06-16 ENCOUNTER — Emergency Department
Admission: EM | Admit: 2017-06-16 | Discharge: 2017-06-16 | Disposition: A | Discharge status: Home / Self Care | Payer: BC Managed Care – PPO | Source: Home / Self Care

## 2017-06-16 DIAGNOSIS — Z203 Contact with and (suspected) exposure to rabies: Secondary | ICD-10-CM | POA: Diagnosis not present

## 2017-06-16 DIAGNOSIS — N951 Menopausal and female climacteric states: Secondary | ICD-10-CM | POA: Insufficient documentation

## 2017-06-16 DIAGNOSIS — N920 Excessive and frequent menstruation with regular cycle: Secondary | ICD-10-CM | POA: Insufficient documentation

## 2017-06-16 NOTE — ED Triage Notes (Signed)
Pt presents today for a second rabies shot.

## 2017-06-23 ENCOUNTER — Other Ambulatory Visit: Payer: Self-pay

## 2017-06-23 ENCOUNTER — Encounter: Payer: Self-pay | Admitting: *Deleted

## 2017-06-23 ENCOUNTER — Emergency Department (INDEPENDENT_AMBULATORY_CARE_PROVIDER_SITE_OTHER)
Admission: EM | Admit: 2017-06-23 | Discharge: 2017-06-23 | Disposition: A | Discharge status: Home / Self Care | Payer: BC Managed Care – PPO | Source: Home / Self Care

## 2017-06-23 DIAGNOSIS — Z23 Encounter for immunization: Secondary | ICD-10-CM

## 2017-06-23 MED ORDER — RABIES VACCINE, PCEC IM SUSR
1.0000 mL | Freq: Once | INTRAMUSCULAR | Status: AC
  Administered 2017-06-23: 1 mL via INTRAMUSCULAR

## 2017-06-23 NOTE — ED Triage Notes (Signed)
Patient is here today for her rabies vaccine.

## 2017-07-07 ENCOUNTER — Encounter: Payer: Self-pay | Admitting: Family Medicine

## 2017-07-07 ENCOUNTER — Ambulatory Visit (INDEPENDENT_AMBULATORY_CARE_PROVIDER_SITE_OTHER): Payer: BC Managed Care – PPO | Admitting: Family Medicine

## 2017-07-07 VITALS — BP 104/68 | HR 76 | Temp 98.6°F | Ht 64.0 in | Wt 107.6 lb

## 2017-07-07 DIAGNOSIS — Z0000 Encounter for general adult medical examination without abnormal findings: Secondary | ICD-10-CM

## 2017-07-07 MED ORDER — AMPHETAMINE-DEXTROAMPHET ER 20 MG PO CP24: 20.0000 mg | ORAL_CAPSULE | ORAL | 0 refills | Status: DC

## 2017-07-07 NOTE — Progress Notes (Signed)
   Subjective:    Patient ID: Tricia Rodriguez, female    DOB: December 21, 1970, 47 y.o.   MRN: [id number]  HPI Here for a well exam. She feels fine.    Review of Systems  Constitutional: Negative.   HENT: Negative.   Eyes: Negative.   Respiratory: Negative.   Cardiovascular: Negative.   Gastrointestinal: Negative.   Genitourinary: Negative for decreased urine volume, difficulty urinating, dyspareunia, dysuria, enuresis, flank pain, frequency, hematuria, pelvic pain and urgency.  Musculoskeletal: Negative.   Skin: Negative.   Neurological: Negative.   Psychiatric/Behavioral: Negative.        Objective:   Physical Exam  Constitutional: She is oriented to person, place, and time. She appears well-developed and well-nourished. No distress.  HENT:  Head: Normocephalic and atraumatic.  Right Ear: External ear normal.  Left Ear: External ear normal.  Nose: Nose normal.  Mouth/Throat: Oropharynx is clear and moist. No oropharyngeal exudate.  Eyes: Pupils are equal, round, and reactive to light. Conjunctivae and EOM are normal. No scleral icterus.  Neck: Normal range of motion. Neck supple. No JVD present. No thyromegaly present.  Cardiovascular: Normal rate, regular rhythm, normal heart sounds and intact distal pulses. Exam reveals no gallop and no friction rub.  No murmur heard. Pulmonary/Chest: Effort normal and breath sounds normal. No respiratory distress. She has no wheezes. She has no rales. She exhibits no tenderness.  Abdominal: Soft. Bowel sounds are normal. She exhibits no distension and no mass. There is no tenderness. There is no rebound and no guarding.  Musculoskeletal: Normal range of motion. She exhibits no edema or tenderness.  Lymphadenopathy:    She has no cervical adenopathy.  Neurological: She is alert and oriented to person, place, and time. She has normal reflexes. She displays normal reflexes. No cranial nerve deficit. She exhibits normal muscle tone. Coordination  normal.  Skin: Skin is warm and dry. No rash noted. No erythema.  Psychiatric: She has a normal mood and affect. Her behavior is normal. Judgment and thought content normal.          Assessment & Plan:  Well exam. We discussed diet and exercise. Get fasting labs soon.  Alysia Penna, MD

## 2017-07-09 ENCOUNTER — Other Ambulatory Visit (INDEPENDENT_AMBULATORY_CARE_PROVIDER_SITE_OTHER): Payer: BC Managed Care – PPO

## 2017-07-09 DIAGNOSIS — Z0000 Encounter for general adult medical examination without abnormal findings: Secondary | ICD-10-CM | POA: Diagnosis not present

## 2017-07-09 LAB — HEPATIC FUNCTION PANEL
ALBUMIN: 4.6 g/dL (ref 3.5–5.2)
ALK PHOS: 57 U/L (ref 39–117)
ALT: 15 U/L (ref 0–35)
AST: 19 U/L (ref 0–37)
BILIRUBIN DIRECT: 0.2 mg/dL (ref 0.0–0.3)
TOTAL PROTEIN: 7.1 g/dL (ref 6.0–8.3)
Total Bilirubin: 0.8 mg/dL (ref 0.2–1.2)

## 2017-07-09 LAB — BASIC METABOLIC PANEL
BUN: 12 mg/dL (ref 6–23)
CALCIUM: 9.3 mg/dL (ref 8.4–10.5)
CHLORIDE: 104 meq/L (ref 96–112)
CO2: 28 meq/L (ref 19–32)
Creatinine, Ser: 0.65 mg/dL (ref 0.40–1.20)
GFR: 102.81 mL/min (ref >60.00)
Glucose, Bld: 93 mg/dL (ref 70–99)
Potassium: 4.3 mEq/L (ref 3.5–5.1)
Sodium: 139 mEq/L (ref 135–145)

## 2017-07-09 LAB — CBC WITH DIFFERENTIAL/PLATELET
BASOS PCT: 0.8 % (ref 0.0–3.0)
Basophils Absolute: 0 10*3/uL (ref 0.0–0.1)
EOS PCT: 1.8 % (ref 0.0–5.0)
Eosinophils Absolute: 0.1 10*3/uL (ref 0.0–0.7)
HEMATOCRIT: 42.9 % (ref 36.0–46.0)
HEMOGLOBIN: 14.6 g/dL (ref 12.0–15.0)
LYMPHS PCT: 31.4 % (ref 12.0–46.0)
Lymphs Abs: 1.2 10*3/uL (ref 0.7–4.0)
MCHC: 34 g/dL (ref 30.0–36.0)
MCV: 93.9 fl (ref 78.0–100.0)
MONOS PCT: 11.8 % (ref 3.0–12.0)
Monocytes Absolute: 0.4 10*3/uL (ref 0.1–1.0)
Neutro Abs: 2 10*3/uL (ref 1.4–7.7)
Neutrophils Relative %: 54.2 % (ref 43.0–77.0)
Platelets: 259 10*3/uL (ref 150.0–400.0)
RBC: 4.57 Mil/uL (ref 3.87–5.11)
RDW: 12.9 % (ref 11.5–15.5)
WBC: 3.8 10*3/uL — AB (ref 4.0–10.5)

## 2017-07-09 LAB — POC URINALSYSI DIPSTICK (AUTOMATED)
Bilirubin, UA: NEGATIVE
Blood, UA: NEGATIVE
GLUCOSE UA: NEGATIVE
KETONES UA: NEGATIVE
LEUKOCYTES UA: NEGATIVE
Nitrite, UA: NEGATIVE
PROTEIN UA: NEGATIVE
SPEC GRAV UA: 1.01 (ref 1.010–1.025)
Urobilinogen, UA: 0.2 E.U./dL
pH, UA: 6.5 (ref 5.0–8.0)

## 2017-07-09 LAB — LIPID PANEL
CHOL/HDL RATIO: 2
Cholesterol: 184 mg/dL (ref 0–200)
HDL: 76 mg/dL (ref >39.00)
LDL CALC: 96 mg/dL (ref 0–99)
NonHDL: 108.07
Triglycerides: 59 mg/dL (ref 0.0–149.0)
VLDL: 11.8 mg/dL (ref 0.0–40.0)

## 2017-07-09 LAB — TSH: TSH: 1.36 u[IU]/mL (ref 0.35–4.50)

## 2017-09-12 ENCOUNTER — Encounter: Payer: Self-pay | Admitting: Family Medicine

## 2017-09-12 ENCOUNTER — Ambulatory Visit: Payer: Self-pay

## 2017-09-12 NOTE — Telephone Encounter (Signed)
Patient called in with c/o "dizziness." She says "it started about a week ago and it happens at any given time. The room spins, I feel like I am drunk. My vision is somewhat blurry and my brain is foggy, which it has been foggy before the dizziness started." I asked about other symptoms, she says "nausea when the room is spinning." According to protocol, see PCP within 3 days. I offered the patient to be seen tomorrow in the [place] office or wait until she can be seen by her PCP, she says "I really want to see what's going on, so I will arrange my work schedule to come in tomorrow." Appointment scheduled for tomorrow at 1115 with Dr. Jonni Sanger, care advice given, patient verbalized understanding.  Reason for Disposition . [1] MILD dizziness (e.g., walking normally) AND [2] has NOT been evaluated by physician for this  (Exception: dizziness caused by heat exposure, sudden standing, or poor fluid intake)  Answer Assessment - Initial Assessment Questions 1. DESCRIPTION: "Describe your dizziness."     Lightheaded, feel drunk 2. LIGHTHEADED: "Do you feel lightheaded?" (e.g., somewhat faint, woozy, weak upon standing)     Yes 3. VERTIGO: "Do you feel like either you or the room is spinning or tilting?" (i.e. vertigo)     Yes 4. SEVERITY: "How bad is it?"  "Do you feel like you are going to faint?" "Can you stand and walk?"   - MILD - walking normally   - MODERATE - interferes with normal activities (e.g., work, school)    - SEVERE - unable to stand, requires support to walk, feels like passing out now.      Mild to moderate 5. ONSET:  "When did the dizziness begin?"     About 1 week ago 6. AGGRAVATING FACTORS: "Does anything make it worse?" (e.g., standing, change in head position)     It just happens anytime 7. HEART RATE: "Can you tell me your heart rate?" "How many beats in 15 seconds?"  (Note: not all patients can do this)       N/A 8. CAUSE: "What do you think is causing the dizziness?"     I don't  know 9. RECURRENT SYMPTOM: "Have you had dizziness before?" If so, ask: "When was the last time?" "What happened that time?"     No 10. OTHER SYMPTOMS: "Do you have any other symptoms?" (e.g., fever, chest pain, vomiting, diarrhea, bleeding)       Blurred vision, brain fog, nausea when room is spinning 11. PREGNANCY: "Is there any chance you are pregnant?" "When was your last menstrual period?"       No  Protocols used: DIZZINESS [facility]

## 2017-09-13 ENCOUNTER — Encounter: Payer: Self-pay | Admitting: Family Medicine

## 2017-09-13 ENCOUNTER — Ambulatory Visit: Payer: BC Managed Care – PPO | Admitting: Family Medicine

## 2017-09-13 VITALS — BP 130/82 | Temp 98.3°F | Resp 14 | Ht 64.0 in | Wt 103.0 lb

## 2017-09-13 DIAGNOSIS — R42 Dizziness and giddiness: Secondary | ICD-10-CM

## 2017-09-13 DIAGNOSIS — R5383 Other fatigue: Secondary | ICD-10-CM | POA: Diagnosis not present

## 2017-09-13 DIAGNOSIS — R0789 Other chest pain: Secondary | ICD-10-CM

## 2017-09-13 MED ORDER — MECLIZINE HCL 25 MG PO TABS: 25.0000 mg | ORAL_TABLET | Freq: Three times a day (TID) | ORAL | 0 refills | Status: DC | PRN

## 2017-09-13 NOTE — Progress Notes (Signed)
Subjective  CC:  Chief Complaint  Patient presents with  . Dizziness    head and chest heaviness;    Evaluated today at the [facility].  Chart reviewed.  HPI: Tricia Rodriguez is a 47 y.o. female who presents to the office today to address the problems listed above in the chief complaint.  Healthy 47 yo presents with a list of ongoing sxs x the last month, worse over the last 2 weeks, including: chest heaviness, awakening with sob, lightheaded, intermittent vertigo, fatigue, elevated blood pressures and constant stress. She feels she will have a stroke.   Had blepharoplasty in early July: bp was very elevated on post op day and day of surgery. This worries her. Typically has low bp.  Had vertigo while exercising on several occassions but also feels "thick headed"  C/o chest heaviness; non exertional. Exercises regularly and vigourously 5x/week. No h/o htn, dm, hld, non smoker, healthy eating. Recent cpe labs all normal.   Anxious and almost tearful because she is worried something is "going on".   ROS: no h/o panic attacks, no f/c/s, pleuritic cp, n/v, paresis, diplopia, dysarthria. Sleeping well. Nl appetite. Denies headaches.  She saw a PT for her vertigo: reports tests for BPPV were all negative.  Assessment  1. Chest pressure   2. Vertigo   3. Fatigue, unspecified type      Plan   sxs:  Unclear etiology; could be stemming from anxiety but need to r/o other causes. No red flags requiring ER eval today. Recommend f/u with pcp for further analysis.   Meclizine to see if helps dizziness. Nl neuro exam.   Low risk for PE or CAD and nl vitals and EKG today.   Reassured but strongly recommend f/u. Patient understands and agrees with care plan.    Follow up: Return in about 1 week (around 09/20/2017) for wiht pcp.   Orders Placed This Encounter  Procedures  . EKG 12-Lead   No orders of the defined types were placed in this encounter.     I reviewed the  patients updated PMH, FH, and SocHx.    Patient Active Problem List   Diagnosis Date Noted  . 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 Anemia, iron deficiency 09/07/2014   No outpatient medications have been marked as taking for the 09/13/17 encounter (Office Visit) with Leamon Arnt, MD.    Allergies: Patient is allergic to penicillins. Family History: Patient family history includes Alcohol abuse in her maternal grandfather and mother; Arthritis in her mother; Diabetes in her maternal grandmother and paternal grandmother; Hyperlipidemia in her father; Hypertension in her father. Social History:  Patient  reports that she has never smoked. She has never used smokeless tobacco. She reports that she drinks alcohol. She reports that she does not use drugs.  Review of Systems: Constitutional: Negative for fever malaise or anorexia Cardiovascular: negative for chest pain Respiratory: negative for SOB or persistent cough Gastrointestinal: negative for abdominal pain  Objective  Vitals: BP 130/82 (BP Location: Left Arm, Patient Position: Sitting, Cuff Size: Normal)   Temp 98.3 F (36.8 C) (Oral)   Resp 14   Ht 5\' 4"  (1.626 m)   Wt 103 lb (46.7 kg)   SpO2 98%   BMI 17.68 kg/m  General: no acute distress , A&Ox3, anxious appearing, thin and fit HEENT: PEERL, conjunctiva normal, Oropharynx moist,neck is supple Cardiovascular:  RRR without murmur or gallop.  Respiratory:  Good breath sounds  bilaterally, CTAB with normal respiratory effort Neuro: nonfocal exam, no nystagmus, nl gait, no tremor Skin:  Warm, no rashes  EKG: nsr, normal ekg.   Commons side effects, risks, benefits, and alternatives for medications and treatment plan prescribed today were discussed, and the patient expressed understanding of the given instructions. Patient is instructed to call or message via MyChart if he/she has any questions or concerns regarding  our treatment plan. No barriers to understanding were identified. We discussed Red Flag symptoms and signs in detail. Patient expressed understanding regarding what to do in case of urgent or emergency type symptoms.   Medication list was reconciled, printed and provided to the patient in AVS. Patient instructions and summary information was reviewed with the patient as documented in the AVS. This note was prepared with assistance of Dragon voice recognition software. Occasional wrong-word or sound-a-like substitutions may have occurred due to the inherent limitations of voice recognition software

## 2017-09-13 NOTE — Patient Instructions (Addendum)
Please schedule a follow up appointment with Dr. Sharlene Motts in the next week or so.  Your blood pressure is in the normal range today, even though it is high for you. This is reassuring.   Go to the ER for any worsening chest pain or severe headaches.   Dizziness can be caused by many things; you and Dr. Sharlene Motts can work together to identify the cause of your dizziness.  Stress can cause symptoms like chest pain, shortness of breath, headaches, elevated blood pressure etc; I am not sure this is the cause of your symptoms but this needs to be considered; it can be improved to help symptoms if it is the cause.   Dizziness Dizziness is a common problem. It is a feeling of unsteadiness or light-headedness. You may feel like you are about to faint. Dizziness can lead to injury if you stumble or fall. Anyone can become dizzy, but dizziness is more common in older adults. This condition can be caused by a number of things, including medicines, dehydration, or illness. Follow these instructions at home: Eating and drinking  Drink enough fluid to keep your urine clear or pale yellow. This helps to keep you from becoming dehydrated. Try to drink more clear fluids, such as water.  Do not drink alcohol.  Limit your caffeine intake if told to do so by your health care provider. Check ingredients and nutrition facts to see if a food or beverage contains caffeine.  Limit your salt (sodium) intake if told to do so by your health care provider. Check ingredients and nutrition facts to see if a food or beverage contains sodium. Activity  Avoid making quick movements. ? Rise slowly from chairs and steady yourself until you feel okay. ? In the morning, first sit up on the side of the bed. When you feel okay, stand slowly while you hold onto something until you know that your balance is fine.  If you need to stand in one place for a long time, move your legs often. Tighten and relax the muscles in your legs while you  are standing.  Do not drive or use heavy machinery if you feel dizzy.  Avoid bending down if you feel dizzy. Place items in your home so that they are easy for you to reach without leaning over. Lifestyle  Do not use any products that contain nicotine or tobacco, such as cigarettes and e-cigarettes. If you need help quitting, ask your health care provider.  Try to reduce your stress level by using methods such as yoga or meditation. Talk with your health care provider if you need help to manage your stress. General instructions  Watch your dizziness for any changes.  Take over-the-counter and prescription medicines only as told by your health care provider. Talk with your health care provider if you think that your dizziness is caused by a medicine that you are taking.  Tell a friend or a family member that you are feeling dizzy. If he or she notices any changes in your behavior, have this person call your health care provider.  Keep all follow-up visits as told by your health care provider. This is important. Contact a health care provider if:  Your dizziness does not go away.  Your dizziness or light-headedness gets worse.  You feel nauseous.  You have reduced hearing.  You have new symptoms.  You are unsteady on your feet or you feel like the room is spinning. Get help right away if:  You vomit  or have diarrhea and are unable to eat or drink anything.  You have problems talking, walking, swallowing, or using your arms, hands, or legs.  You feel generally weak.  You are not thinking clearly or you have trouble forming sentences. It may take a friend or family member to notice this.  You have chest pain, abdominal pain, shortness of breath, or sweating.  Your vision changes.  You have any bleeding.  You have a severe headache.  You have neck pain or a stiff neck.  You have a fever. These symptoms may represent a serious problem that is an emergency. Do not wait  to see if the symptoms will go away. Get medical help right away. Call your local emergency services (911 in the [place] Do not drive yourself to the hospital. Summary  Dizziness is a feeling of unsteadiness or light-headedness. This condition can be caused by a number of things, including medicines, dehydration, or illness.  Anyone can become dizzy, but dizziness is more common in older adults.  Drink enough fluid to keep your urine clear or pale yellow. Do not drink alcohol.  Avoid making quick movements if you feel dizzy. Monitor your dizziness for any changes. This information is not intended to replace advice given to you by your health care provider. Make sure you discuss any questions you have with your health care provider. Document Released: 07/17/2000 Document Revised: 02/24/2016 Document Reviewed: 02/24/2016 Elsevier Interactive Patient Education  [employer].

## 2017-09-15 ENCOUNTER — Ambulatory Visit: Payer: BC Managed Care – PPO | Admitting: Family Medicine

## 2017-09-15 ENCOUNTER — Encounter: Payer: Self-pay | Admitting: Family Medicine

## 2017-09-15 VITALS — BP 120/62 | Temp 98.3°F | Ht 64.0 in | Wt 105.0 lb

## 2017-09-15 DIAGNOSIS — F411 Generalized anxiety disorder: Secondary | ICD-10-CM | POA: Diagnosis not present

## 2017-09-15 MED ORDER — SERTRALINE HCL 50 MG PO TABS: 50.0000 mg | ORAL_TABLET | Freq: Every day | ORAL | 3 refills | Status: DC

## 2017-09-15 NOTE — Progress Notes (Signed)
   Subjective:    Patient ID: Tricia Rodriguez, female    DOB: [birth year], 47 y.o.   MRN: [id number]  HPI Here to follow up on potential anxiety. She has had a rough several weeks with intermittent chest pressure, SOB, heart pounding, lightheadedness, dizziness, and elevated BP readings (as high as 206 systolic). She went to the [facility] clinic 2 days ago and had a normal exam and normal EKG. It was suggested her symptoms were stress related and she was told to see [place]. Today she feels fairly well but agrees that she has a lot of stress in her life. Her daughter who is in college takes Zoloft for anxiety, and this has worked very well for her. [name] continues to work out vigorously 5 days a week.    Review of Systems  Constitutional: Negative.   Respiratory: Positive for shortness of breath. Negative for cough.   Cardiovascular: Positive for chest pain and palpitations. Negative for leg swelling.  Neurological: Negative.   Psychiatric/Behavioral: Negative for confusion, dysphoric mood and hallucinations. The patient is nervous/anxious.        Objective:   Physical Exam  Constitutional: She is oriented to person, place, and time. She appears well-developed and well-nourished.  Cardiovascular: Normal rate, regular rhythm, normal heart sounds and intact distal pulses.  Pulmonary/Chest: Effort normal and breath sounds normal.  Neurological: She is alert and oriented to person, place, and time.  Psychiatric: Her behavior is normal. Thought content normal.  Anxious           Assessment & Plan:  Anxiety, try Zoloft 50 mg daily. Recheck in 4 weeks.  Alysia Penna, MD

## 2017-10-29 ENCOUNTER — Telehealth: Payer: Self-pay | Admitting: Family Medicine

## 2017-10-29 NOTE — Telephone Encounter (Signed)
Copied from [place] [id number]. Topic: Quick Communication - Rx Refill/Question >> Oct 29, 2017  9:42 AM Antonieta Iba C wrote: Medication: amphetamine-dextroamphetamine (ADDERALL XR) 20 MG 24 hr capsule   Has the patient contacted their pharmacy? No  (Agent: If no, request that the patient contact the pharmacy for the refill.) (Agent: If yes, when and what did the pharmacy advise?)  Preferred Pharmacy (with phone number or street name): [id number] - [place], [place] - [address]  Agent: Please be advised that RX refills may take up to 3 business days. We ask that you follow-up with your pharmacy.

## 2017-10-29 NOTE — Telephone Encounter (Signed)
Adderall XR  20 mg refill Last Refill: 09/06/17 # 30 Last OV: 09/15/17 PCP: S. Fry [facility]  # [phone]  Controlled medication

## 2017-10-30 MED ORDER — AMPHETAMINE-DEXTROAMPHET ER 20 MG PO CP24: 20.0000 mg | ORAL_CAPSULE | ORAL | 0 refills | Status: DC

## 2017-10-30 MED ORDER — AMPHETAMINE-DEXTROAMPHET ER 20 MG PO CP24
20.0000 mg | ORAL_CAPSULE | ORAL | 0 refills | Status: DC
Start: 2017-12-30 — End: 2018-02-11

## 2017-10-30 NOTE — Telephone Encounter (Signed)
Done

## 2017-10-30 NOTE — Telephone Encounter (Signed)
Patient has been notified, left a message on verified voicemail.

## 2017-11-18 ENCOUNTER — Ambulatory Visit (INDEPENDENT_AMBULATORY_CARE_PROVIDER_SITE_OTHER): Payer: BC Managed Care – PPO | Admitting: Sports Medicine

## 2017-11-18 ENCOUNTER — Ambulatory Visit
Admission: RE | Admit: 2017-11-18 | Discharge: 2017-11-18 | Disposition: A | Discharge status: Home / Self Care | Payer: BC Managed Care – PPO | Source: Ambulatory Visit | Attending: Sports Medicine | Admitting: Sports Medicine

## 2017-11-18 VITALS — BP 122/72 | Ht 64.0 in | Wt 105.0 lb

## 2017-11-18 DIAGNOSIS — M25512 Pain in left shoulder: Secondary | ICD-10-CM

## 2017-11-19 ENCOUNTER — Encounter: Payer: Self-pay | Admitting: Sports Medicine
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
## 2017-11-23 ENCOUNTER — Other Ambulatory Visit: Payer: BC Managed Care – PPO

## 2017-11-26 ENCOUNTER — Encounter: Payer: Self-pay | Admitting: Sports Medicine

## 2017-12-04 ENCOUNTER — Telehealth: Payer: Self-pay

## 2017-12-04 ENCOUNTER — Ambulatory Visit
Admission: RE | Admit: 2017-12-04 | Discharge: 2017-12-04 | Disposition: A | Discharge status: Home / Self Care | Payer: BC Managed Care – PPO | Source: Ambulatory Visit | Attending: Sports Medicine | Admitting: Sports Medicine

## 2017-12-04 DIAGNOSIS — M25512 Pain in left shoulder: Secondary | ICD-10-CM

## 2017-12-04 NOTE — Telephone Encounter (Signed)
Referral placed to Dr. Onnie Graham with [facility]. Pt understands.

## 2017-12-08 ENCOUNTER — Telehealth: Payer: Self-pay | Admitting: Sports Medicine

## 2017-12-08 NOTE — Telephone Encounter (Signed)
Patient notified via telephone last week of findings regarding the MRI of her left shoulder.  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  Based on these findings I recommended referral to Dr. Onnie Graham.  I will defer further work-up and treatment to the discretion of Dr. Onnie Graham.  Patient will follow-up with me as needed.

## 2018-01-04 HISTORY — PX: SHOULDER SURGERY: SHX246

## 2018-01-12 ENCOUNTER — Other Ambulatory Visit: Payer: Self-pay | Admitting: Family Medicine

## 2018-02-10 ENCOUNTER — Other Ambulatory Visit: Payer: Self-pay | Admitting: Family Medicine

## 2018-02-11 ENCOUNTER — Other Ambulatory Visit: Payer: Self-pay | Admitting: Family Medicine

## 2018-02-11 NOTE — Telephone Encounter (Signed)
Copied from [place] [id number]. Topic: Quick Communication - Rx Refill/Question >> Feb 11, 2018  8:42 AM Celedonio Savage L wrote: Medication: amphetamine-dextroamphetamine (ADDERALL XR) 20 MG 24 hr capsule (Expired)  Has the patient contacted their pharmacy? Yes.   (Agent: If no, request that the patient contact the pharmacy for the refill.) (Agent: If yes, when and what did the pharmacy advise?) last night told her to call provider  Preferred Pharmacy (with phone number or street name): [id number] [place], [place] - [address] [phone] (Phone) [phone] (Fax)    Agent: Please be advised that RX refills may take up to 3 business days. We ask that you follow-up with your pharmacy.

## 2018-02-13 NOTE — Telephone Encounter (Signed)
Dr. Sarajane Jews please advise on refill of medication. thanks

## 2018-02-16 ENCOUNTER — Ambulatory Visit: Payer: BC Managed Care – PPO | Admitting: Family Medicine

## 2018-02-16 MED ORDER — AMPHETAMINE-DEXTROAMPHET ER 20 MG PO CP24: 20.0000 mg | ORAL_CAPSULE | ORAL | 0 refills | Status: DC

## 2018-02-16 NOTE — Telephone Encounter (Signed)
Refills were sent in. No need for an appt today. Please cancel her appt for today

## 2018-03-09 ENCOUNTER — Other Ambulatory Visit: Payer: Self-pay | Admitting: Family Medicine

## 2018-03-11 ENCOUNTER — Encounter: Payer: Self-pay | Admitting: Family Medicine

## 2018-03-11 NOTE — Telephone Encounter (Signed)
Dr Fry please advise. thanks 

## 2018-03-12 MED ORDER — SERTRALINE HCL 100 MG PO TABS: 100.0000 mg | ORAL_TABLET | Freq: Every day | ORAL | 3 refills | Status: DC

## 2018-03-12 NOTE — Telephone Encounter (Signed)
Increase the Zoloft to 100 mg daily. Call in #[phone] with one rf

## 2018-04-13 ENCOUNTER — Telehealth: Payer: Self-pay | Admitting: Family Medicine

## 2018-04-13 NOTE — Telephone Encounter (Signed)
Please call the pharmacy to refill the Adderall early. She should wash her hands frequently during the trip

## 2018-04-13 NOTE — Telephone Encounter (Signed)
Copied from [place] [id number]. Topic: Quick Communication - Rx Refill/Question >> Apr 13, 2018 10:39 AM Margot Ables wrote: Medication: amphetamine-dextroamphetamine (ADDERALL XR) 20 MG 24 hr capsule - pt is leaving tomorrow morning unexpectedly to get her daughter - she will be traveling to [place]. Her daughter has been in [place] for 7 weeks. Daughter has no symptoms for CV or other illness - pt has to be in quarantine for 2 weeks once returning home. RX for Adderall is due to be filled on 3/13 - she contacted the pharmacy and was advised they need provider authorization to fill early - pt requesting today since she will not be able to go out anywhere for a couple weeks and leaving tomorrow 3/10 in the morning  She also asked if there are any precautions she can take to prevent CV when traveling.  Has the patient contacted their pharmacy? Yes - need  Preferred Pharmacy (with phone number or street name): [id number] - [place], [place] - [address] [phone] (Phone) [phone] (Fax)

## 2018-04-13 NOTE — Telephone Encounter (Signed)
Dr Fry please advise. thanks 

## 2018-04-13 NOTE — Telephone Encounter (Signed)
Called and spoke with Tye Maryland at the pharmacy and they will get the meds ready for her today. Called and spoke with pt and she is aware of Dr. Barbie Banner recs.

## 2018-05-05 ENCOUNTER — Other Ambulatory Visit: Payer: Self-pay | Admitting: Obstetrics and Gynecology

## 2018-05-05 DIAGNOSIS — Z1231 Encounter for screening mammogram for malignant neoplasm of breast: Secondary | ICD-10-CM

## 2018-05-19 ENCOUNTER — Telehealth: Payer: BC Managed Care – PPO | Admitting: Physician Assistant

## 2018-05-19 DIAGNOSIS — R058 Other specified cough: Secondary | ICD-10-CM

## 2018-05-19 DIAGNOSIS — R05 Cough: Secondary | ICD-10-CM

## 2018-05-19 NOTE — Progress Notes (Signed)
E-Visit for Corona Virus Screening  Based on your current symptoms, you may very well have the virus, however your symptoms are mild. Currently, not all patients are being tested. If the symptoms are mild and there is not a known exposure, performing the test is not indicated.  Coronavirus disease 2019 (COVID-19)is a respiratory illness that can spread from person to person. The virus that causes COVID-19 is a new virus that was first identified in the country of [place] but is now found in multiple other countries and has spread to the [place].  Symptoms associated with the virus are mild to severe fever, cough, and shortness of breath. There is currently no vaccine to protect against COVID-19, and there is no specific antiviral treatment for the virus.   To be considered HIGH RISK for Coronavirus (COVID-19), you have to meet the following criteria:  . Traveled to [place], [place], [place], [place] or [place]; or in the [place] to [place], [place], [place], or [place]; and have fever, cough, and shortness of breath within the last 2 weeks of travel OR  . Been in close contact with a person diagnosed with COVID-19 within the last 2 weeks and have fever, cough, and shortness of breath  . IF YOU DO NOT MEET THESE CRITERIA, YOU ARE CONSIDERED LOW RISK FOR COVID-19.   It is vitally important that if you feel that you have an infection such as this virus or any other virus that you stay home and away from places where you may spread it to others.  You should self-quarantine for 14 days if you have symptoms that could potentially be coronavirus and avoid contact with people age 18 and older.    You may take acetaminophen (Tylenol) as needed for fever.   Reduce your risk of any infection by using the same precautions used for avoiding the common cold or flu: Wash your hands often with soap and warm water for at least 20 seconds.  If soap and water are not readily available, use an  alcohol-based hand sanitizer with at least 60% alcohol.  If coughing or sneezing, cover your mouth and nose by coughing or sneezing into the elbow areas of your shirt or coat, into a tissue or into your sleeve (not your hands). Avoid shaking hands with others and consider head nods or verbal greetings only.  Avoid touching your eyes,nose, or mouth with unwashed hands. Avoid close contact with people who are sick. Avoid places or events with large numbers of people in one location, like concerts or sporting events. Carefully consider travel plans you have or are making. If you are planning any travel outside or inside the [place], visit the CDC'sTravelers' Health webpagefor the latest health notices. If you have some symptoms but not all symptoms, continue to monitor at home and seek medical attention if your symptoms worsen. If you are having a medical emergency, call 911.  HOME CARE Only take medications as instructed by your medical team. Drink plenty of fluids and get plenty of rest. A steam or ultrasonic humidifier can help if you have congestion.   GET HELP RIGHT AWAY IF: You develop worsening fever. You become short of breath You cough up blood. Your symptoms become more severe MAKE SURE YOU  Understand these instructions. Will watch your condition. Will get help right away if you are not doing well or get worse.  Your e-visit answers were reviewed by a board certified advanced clinical practitioner to complete your personal  care plan.  Depending on the condition, your plan could have included both over the counter or prescription medications.  If there is a problem please reply once you have received a response from your provider. Your safety is important to [place].  If you have drug allergies check your prescription carefully.    You can use MyChart to ask questions about today's visit, request a non-urgent call back, or ask for a work or school excuse for 24 hours related to this  e-Visit. If it has been greater than 24 hours you will need to follow up with your provider, or enter a new e-Visit to address those concerns. You will get an e-mail in the next two days asking about your experience.  I hope that your e-visit has been valuable and will speed your recovery. Thank you for using e-visits.    ===View-only below this line===   ----- Message -----    From: Irene Limbo    Sent: 05/19/2018 12:00 PM EDT      To: E-Visit Mailing List Subject: E-Visit Submission: CoronaVirus ([id number]-27) Screening  E-Visit Submission: CoronaVirus ([id number]) Screening --------------------------------  Question: Do you have any of the following?  Answer:   Shortness of breath            Cough  Question: If you are experiencing trouble breathing please select the severity of this:  Answer:   I am not having any trouble breathing (N/A)  Question: Do you have any of the following additional symptoms?  Answer:   Headache  Question: Have you had a fever? Answer:   No  Question: Have others in your home or workplace had similar symptoms? Answer:   No  Question: When did your symptoms start? Answer:   In [place]- around 3/15  Question: Have you recently visited any of the following countries? Answer:   None of these  Question: If you have traveled anywhere in the last  2 months please document where you have visited: Answer:   [place], [place],  [place]  Question: Have you recently been around others from these countries or visited these countries who have had coughing or fever? Answer:   Yes  Question: Have you recently been around anyone who has been diagnosed with Corona virus? Answer:   No  Question: Have you been taking any medications? Answer:   Yes  Question: If taking medications for these symptoms, please list the names and whether they are helping or not Answer:     Question: Are you treated for any of the following conditions: Asthma, COPD, Diabetes,  Renal Failure (on Dialysis), AIDS, any Neuromuscular disease that effects the clearing of secretions, Heart Failure, or Heart Disease? Answer:   No  Question: Please enter a phone number where you can be reached if we have additional questions about your symptoms Answer:   [phone]  Question: Please list your medication allergies that you may have ? (If 'none' , please list as 'none') Answer:   Penicillin  Question: Please list any additional comments  Answer:   My daughter was a study abroad Ship broker in [place]. I went to [place] to go get her (long story on why) She was in [place], [place] before I met her in [place]  (her roommate in [place] and [place] had a terrible cough but was never tested.) my daughter has no symptoms and I don't feel bad but I do have a heaviness in my chest that feels much like Bronchitis but I do not have trouble breathing  but do become winded easily when exercising.  Question: Are you pregnant? Answer:   I am confident that I am not pregnant  Question: Are you breastfeeding? Answer:   No  A total of 5-10 minutes was spent evaluating this patients questionnaire and formulating a plan of care.

## 2018-05-29 ENCOUNTER — Other Ambulatory Visit: Payer: Self-pay | Admitting: Family Medicine

## 2018-06-01 NOTE — Telephone Encounter (Signed)
Requested medication (s) are due for refill today: Yes  Requested medication (s) are on the active medication list: Yes  Last refill:  04/17/18  Future visit scheduled: Yes  Notes to clinic:  Unable to refill, cannot delegate     Requested Prescriptions  Pending Prescriptions Disp Refills   amphetamine-dextroamphetamine (ADDERALL XR) 20 MG 24 hr capsule 30 capsule 0    Sig: Take 1 capsule (20 mg total) by mouth every morning for 30 days.     Not Delegated - Psychiatry:  Stimulants/ADHD Failed - 06/01/2018  9:33 AM      Failed - This refill cannot be delegated      Failed - Urine Drug Screen completed in last 360 days.      Failed - Valid encounter within last 3 months    Recent Outpatient Visits          8 months ago Anxiety, generalized   [facility] at [place], MD   8 months ago Chest pressure   [place], [place]   10 months ago Preventative health care   [place] [facility] [place], Ishmael Holter, MD   1 year ago Acute bronchitis, unspecified organism   Therap[facility], [facility] at [place], MD   1 year ago Continuous LUQ abdominal pain   [place] [facility] [place], MD      Future Appointments            In 2 months Sarajane Jews, Ishmael Holter, MD [employer] at [place], [facility]

## 2018-06-01 NOTE — Telephone Encounter (Signed)
Patient called to check status.

## 2018-06-02 MED ORDER — AMPHETAMINE-DEXTROAMPHET ER 20 MG PO CP24: 20.0000 mg | ORAL_CAPSULE | ORAL | 0 refills | Status: DC

## 2018-06-02 NOTE — Telephone Encounter (Signed)
Dr. Sarajane Jews please advise on refil. Thanks

## 2018-06-02 NOTE — Telephone Encounter (Signed)
Done

## 2018-06-13 ENCOUNTER — Encounter: Payer: Self-pay | Admitting: Family Medicine

## 2018-06-13 DIAGNOSIS — R6889 Other general symptoms and signs: Secondary | ICD-10-CM

## 2018-06-15 NOTE — Telephone Encounter (Signed)
She can come here to be drawn since our IgG test is sent to [facility]. I have put in the order.

## 2018-06-15 NOTE — Telephone Encounter (Signed)
Dr Fry please advise. thanks 

## 2018-06-17 ENCOUNTER — Other Ambulatory Visit: Payer: BC Managed Care – PPO

## 2018-06-17 ENCOUNTER — Other Ambulatory Visit: Payer: Self-pay

## 2018-06-17 ENCOUNTER — Other Ambulatory Visit (INDEPENDENT_AMBULATORY_CARE_PROVIDER_SITE_OTHER): Payer: BC Managed Care – PPO

## 2018-06-17 DIAGNOSIS — R6889 Other general symptoms and signs: Secondary | ICD-10-CM | POA: Diagnosis not present

## 2018-06-18 LAB — SAR COV2 SEROLOGY (COVID19)AB(IGG),IA: SARS CoV2 AB IGG: NEGATIVE

## 2018-06-19 ENCOUNTER — Encounter: Payer: Self-pay | Admitting: *Deleted

## 2018-06-24 ENCOUNTER — Ambulatory Visit
Admission: RE | Admit: 2018-06-24 | Discharge: 2018-06-24 | Disposition: A | Discharge status: Home / Self Care | Payer: BC Managed Care – PPO | Source: Ambulatory Visit | Attending: Obstetrics and Gynecology | Admitting: Obstetrics and Gynecology

## 2018-06-24 ENCOUNTER — Other Ambulatory Visit: Payer: Self-pay

## 2018-06-24 DIAGNOSIS — Z1231 Encounter for screening mammogram for malignant neoplasm of breast: Secondary | ICD-10-CM

## 2018-06-26 ENCOUNTER — Ambulatory Visit: Payer: BC Managed Care – PPO

## 2018-07-02 ENCOUNTER — Other Ambulatory Visit: Payer: Self-pay

## 2018-07-02 ENCOUNTER — Encounter: Payer: Self-pay | Admitting: Family Medicine

## 2018-07-02 ENCOUNTER — Ambulatory Visit (INDEPENDENT_AMBULATORY_CARE_PROVIDER_SITE_OTHER): Payer: BC Managed Care – PPO | Admitting: Family Medicine

## 2018-07-02 DIAGNOSIS — L255 Unspecified contact dermatitis due to plants, except food: Secondary | ICD-10-CM

## 2018-07-02 MED ORDER — HYDROXYZINE HCL 25 MG PO TABS: 25.0000 mg | ORAL_TABLET | Freq: Three times a day (TID) | ORAL | 0 refills | Status: DC | PRN

## 2018-07-02 MED ORDER — PREDNISONE 10 MG PO TABS: ORAL_TABLET | ORAL | 0 refills | Status: DC

## 2018-07-02 NOTE — Progress Notes (Signed)
Virtual Visit via Video Note  I connected with Pryor Montes F. Prasad on 07/02/18 at  2:30 PM EDT by a video enabled telemedicine application and verified that I am speaking with the correct person using two identifiers.  Location patient: car Location provider:work or home office Persons participating in the virtual visit: patient, provider  I discussed the limitations of evaluation and management by telemedicine and the availability of in person appointments. The patient expressed understanding and agreed to proceed.   HPI: Pt with rash all over her body started yesterday.  Was cleaning out her cabin in the [place] when she came into contact with poison ivy.  Endorses intense itching.  Has not tried anything for this.  Unable to sleep at night 2/2 being uncomfortable.  Denies SOB, CP, cough, n/v, edema, SOB.  Pt inquires about a cortisone injection.   ROS: See pertinent positives and negatives per HPI.  Past Medical History:  Diagnosis Date  . Anemia   . Menorrhagia   . PONV (postoperative nausea and vomiting)   . Uterine fibroid   . Wears contact lenses     Past Surgical History:  Procedure Laterality Date  . DILATION AND CURETTAGE OF UTERUS  2000  . DILITATION & CURRETTAGE/HYSTROSCOPY WITH NOVASURE ABLATION N/A 01/23/2015   Procedure: DILATATION & CURETTAGE/HYSTEROSCOPY WITH RESECTOSCOPE;  Surgeon: Olga Millers, MD;  Location: [facility];  Service: Gynecology;  Laterality: N/A;  . KNEE SURGERY Bilateral 1980's  . SHOULDER SURGERY    . WRIST SURGERY  1999    Family History  Problem Relation Age of Onset  . Alcohol abuse Mother   . Arthritis Mother   . Hyperlipidemia Father   . Hypertension Father   . Diabetes Maternal Grandmother   . Alcohol abuse Maternal Grandfather   . Diabetes Paternal Grandmother     SOCIAL HX:    Current Outpatient Medications:  .  [START ON 08/02/2018] amphetamine-dextroamphetamine (ADDERALL XR) 20 MG 24 hr capsule, Take 1  capsule (20 mg total) by mouth every morning for 30 days., Disp: 30 capsule, Rfl: 0 .  meclizine (ANTIVERT) 25 MG tablet, Take 1 tablet (25 mg total) by mouth 3 (three) times daily as needed for dizziness. (Patient not taking: Reported on 11/18/2017), Disp: 30 tablet, Rfl: 0 .  sertraline (ZOLOFT) 100 MG tablet, Take 1 tablet (100 mg total) by mouth daily., Disp: 90 tablet, Rfl: 3  EXAM:  VITALS per patient if applicable: RR between 42-35 bpm  GENERAL: alert, oriented, appears well and in no acute distress  HEENT: atraumatic, conjunctiva clear, no obvious abnormalities on inspection of external nose and ears  NECK: normal movements of the head and neck  LUNGS: on inspection no signs of respiratory distress, breathing rate appears normal, no obvious gross SOB, gasping or wheezing  CV: no obvious cyanosis  SKIN: Pt wearing longsleeve top to cover the blotchy erythematous plaques on UEs, LEs.  MS: moves all visible extremities without noticeable abnormality  PSYCH/NEURO: pleasant and cooperative, no obvious depression or anxiety, speech and thought processing grossly intact  ASSESSMENT AND PLAN:  Discussed the following assessment and plan:  Dermatitis due to plants, including poison ivy, sumac, and oak  -given large area of body involved will opt for po medications as opposed to topical. -will start an 18 day steroid taper to prevent rebound pruritis. - Plan: hydrOXYzine (ATARAX/VISTARIL) 25 MG tablet, predniSONE (DELTASONE) 10 MG tablet -given precautions -f/u prn    I discussed the assessment and treatment plan with  the patient. The patient was provided an opportunity to ask questions and all were answered. The patient agreed with the plan and demonstrated an understanding of the instructions.   The patient was advised to call back or seek an in-person evaluation if the symptoms worsen or if the condition fails to improve as anticipated.   Billie Ruddy, MD

## 2018-07-14 ENCOUNTER — Encounter: Payer: BC Managed Care – PPO | Admitting: Family Medicine

## 2018-08-10 ENCOUNTER — Other Ambulatory Visit: Payer: Self-pay

## 2018-08-10 ENCOUNTER — Ambulatory Visit (INDEPENDENT_AMBULATORY_CARE_PROVIDER_SITE_OTHER): Payer: BC Managed Care – PPO | Admitting: Family Medicine

## 2018-08-10 ENCOUNTER — Encounter: Payer: Self-pay | Admitting: Family Medicine

## 2018-08-10 VITALS — BP 120/72 | HR 75 | Temp 98.3°F | Ht 64.5 in | Wt 109.1 lb

## 2018-08-10 DIAGNOSIS — Z0000 Encounter for general adult medical examination without abnormal findings: Secondary | ICD-10-CM | POA: Diagnosis not present

## 2018-08-10 LAB — CBC WITH DIFFERENTIAL/PLATELET
Basophils Absolute: 0 10*3/uL (ref 0.0–0.1)
Basophils Relative: 0.5 % (ref 0.0–3.0)
Eosinophils Absolute: 0.1 10*3/uL (ref 0.0–0.7)
Eosinophils Relative: 1.8 % (ref 0.0–5.0)
HCT: 42.4 % (ref 36.0–46.0)
Hemoglobin: 14.3 g/dL (ref 12.0–15.0)
Lymphocytes Relative: 26.9 % (ref 12.0–46.0)
Lymphs Abs: 1.2 10*3/uL (ref 0.7–4.0)
MCHC: 33.7 g/dL (ref 30.0–36.0)
MCV: 93.9 fl (ref 78.0–100.0)
Monocytes Absolute: 0.5 10*3/uL (ref 0.1–1.0)
Monocytes Relative: 11.9 % (ref 3.0–12.0)
Neutro Abs: 2.5 10*3/uL (ref 1.4–7.7)
Neutrophils Relative %: 58.9 % (ref 43.0–77.0)
Platelets: 224 10*3/uL (ref 150.0–400.0)
RBC: 4.51 Mil/uL (ref 3.87–5.11)
RDW: 13 % (ref 11.5–15.5)
WBC: 4.3 10*3/uL (ref 4.0–10.5)

## 2018-08-10 LAB — POC URINALSYSI DIPSTICK (AUTOMATED)
Bilirubin, UA: NEGATIVE
Blood, UA: NEGATIVE
Glucose, UA: NEGATIVE
Ketones, UA: NEGATIVE
Leukocytes, UA: NEGATIVE
Nitrite, UA: NEGATIVE
Protein, UA: NEGATIVE
Spec Grav, UA: 1.01 (ref 1.010–1.025)
Urobilinogen, UA: 0.2 E.U./dL
pH, UA: 7.5 (ref 5.0–8.0)

## 2018-08-10 LAB — BASIC METABOLIC PANEL
BUN: 10 mg/dL (ref 6–23)
CO2: 28 mEq/L (ref 19–32)
Calcium: 8.6 mg/dL (ref 8.4–10.5)
Chloride: 105 mEq/L (ref 96–112)
Creatinine, Ser: 0.65 mg/dL (ref 0.40–1.20)
GFR: 96.3 mL/min (ref >60.00)
Glucose, Bld: 88 mg/dL (ref 70–99)
Potassium: 4.5 mEq/L (ref 3.5–5.1)
Sodium: 139 mEq/L (ref 135–145)

## 2018-08-10 LAB — LIPID PANEL
Cholesterol: 191 mg/dL (ref 0–200)
HDL: 88.4 mg/dL (ref >39.00)
LDL Cholesterol: 87 mg/dL (ref 0–99)
NonHDL: 102.4
Total CHOL/HDL Ratio: 2
Triglycerides: 76 mg/dL (ref 0.0–149.0)
VLDL: 15.2 mg/dL (ref 0.0–40.0)

## 2018-08-10 LAB — HEPATIC FUNCTION PANEL
ALT: 18 U/L (ref 0–35)
AST: 22 U/L (ref 0–37)
Albumin: 4.5 g/dL (ref 3.5–5.2)
Alkaline Phosphatase: 62 U/L (ref 39–117)
Bilirubin, Direct: 0.1 mg/dL (ref 0.0–0.3)
Total Bilirubin: 0.5 mg/dL (ref 0.2–1.2)
Total Protein: 6.9 g/dL (ref 6.0–8.3)

## 2018-08-10 LAB — TSH: TSH: 1.56 u[IU]/mL (ref 0.35–4.50)

## 2018-08-10 MED ORDER — ALPRAZOLAM 0.5 MG PO TABS: 0.5000 mg | ORAL_TABLET | Freq: Two times a day (BID) | ORAL | 0 refills | Status: DC | PRN

## 2018-08-10 NOTE — Progress Notes (Signed)
Subjective:    Patient ID: Tricia Rodriguez, female    DOB: May 13, 1970, 48 y.o.   MRN: [id number]  HPI Here for a well exam. She feels great physically, but her anxiety has been a real problem for her. She has been taking Zoloft for the past 2 years but it doesn't really help. We tried increasing the dose to 100 mg with no improvement. She still feels anxious all the time and "jittery". Appetite and sleep are not affected. She works out daily.    Review of Systems  Constitutional: Negative.   HENT: Negative.   Eyes: Negative.   Respiratory: Negative.   Cardiovascular: Negative.   Gastrointestinal: Negative.   Genitourinary: Negative for decreased urine volume, difficulty urinating, dyspareunia, dysuria, enuresis, flank pain, frequency, hematuria, pelvic pain and urgency.  Musculoskeletal: Negative.   Skin: Negative.   Neurological: Negative.   Psychiatric/Behavioral: Positive for agitation. Negative for behavioral problems, confusion, decreased concentration, dysphoric mood, hallucinations, self-injury, sleep disturbance and suicidal ideas. The patient is nervous/anxious. The patient is not hyperactive.        Objective:   Physical Exam Constitutional:      General: She is not in acute distress.    Appearance: Normal appearance. She is well-developed. She is not diaphoretic.  HENT:     Head: Normocephalic and atraumatic.     Right Ear: External ear normal.     Left Ear: External ear normal.     Nose: Nose normal.     Mouth/Throat:     Pharynx: No oropharyngeal exudate.  Eyes:     General: No scleral icterus.       Right eye: No discharge.        Left eye: No discharge.     Conjunctiva/sclera: Conjunctivae normal.     Pupils: Pupils are equal, round, and reactive to light.  Neck:     Musculoskeletal: Normal range of motion and neck supple.     Thyroid: No thyromegaly.     Vascular: No JVD.  Cardiovascular:     Rate and Rhythm: Normal rate and regular rhythm.   Heart sounds: Normal heart sounds. No murmur. No friction rub. No gallop.   Pulmonary:     Effort: Pulmonary effort is normal. No respiratory distress.     Breath sounds: Normal breath sounds. No stridor. No wheezing or rales.  Chest:     Chest wall: No tenderness.  Abdominal:     General: Bowel sounds are normal. There is no distension or abdominal bruit.     Palpations: Abdomen is soft. Abdomen is not rigid. There is no mass.     Tenderness: There is no abdominal tenderness. There is no guarding or rebound.     Hernia: No hernia is present.  Genitourinary:    Vagina: Normal. No erythema, tenderness or bleeding.     Cervix: No cervical motion tenderness, discharge or friability.     Adnexa:        Right: No mass, tenderness or fullness.         Left: No mass, tenderness or fullness.    Musculoskeletal: Normal range of motion.        General: No tenderness.  Lymphadenopathy:     Cervical: No cervical adenopathy.  Skin:    General: Skin is warm and dry.     Coloration: Skin is not pale.     Findings: No erythema or rash.  Neurological:     Mental Status: She is alert and oriented to person,  place, and time.     Cranial Nerves: No cranial nerve deficit.     Motor: No abnormal muscle tone.     Coordination: Coordination normal.     Deep Tendon Reflexes: Reflexes are normal and symmetric. Reflexes normal.  Psychiatric:        Behavior: Behavior normal.        Thought Content: Thought content normal.        Judgment: Judgment normal.     Comments: Good eye contact, but she appears anxious. She shakes her foot constantly when she sits with her legs crossed           Assessment & Plan:  Well exam. We discussed diet and exercise. Get fasting labs. For the anxiety she will try Xanax 0.5 mg bid as needed. She will taper off Zoloft over the next 2 weeks. Set up a colonoscopy.  Alysia Penna, MD

## 2018-08-14 ENCOUNTER — Encounter: Payer: Self-pay | Admitting: *Deleted

## 2018-08-30 ENCOUNTER — Telehealth: Payer: BC Managed Care – PPO | Admitting: Family

## 2018-08-30 DIAGNOSIS — Z20822 Contact with and (suspected) exposure to covid-19: Secondary | ICD-10-CM

## 2018-08-30 MED ORDER — BENZONATATE 100 MG PO CAPS: 100.0000 mg | ORAL_CAPSULE | Freq: Three times a day (TID) | ORAL | 0 refills | Status: DC | PRN

## 2018-08-30 NOTE — Progress Notes (Signed)
E-Visit for Corona Virus Screening   Your current symptoms could be consistent with the coronavirus.  Many health care providers can now test patients at their office but not all are.  [facility] has multiple testing sites. For information on our COVID testing locations and hours go to [email or web address]  Please quarantine yourself while awaiting your test results.  We are enrolling you in our [facility] for [place] . Daily you will receive a questionnaire within the [facility] website. Our COVID 19 response team willl be monitoriing your responses daily.   The testing site does not open until Monday morning at 8 AM.  Approximately 5 minutes was spent documenting and reviewing patient's chart.   COVID-19 is a respiratory illness with symptoms that are similar to the flu. Symptoms are typically mild to moderate, but there have been cases of severe illness and death due to the virus. The following symptoms may appear 2-14 days after exposure: . Fever . Cough . Shortness of breath or difficulty breathing . Chills . Repeated shaking with chills . Muscle pain . Headache . Sore throat . New loss of taste or smell . Fatigue . Congestion or runny nose . Nausea or vomiting . Diarrhea  It is vitally important that if you feel that you have an infection such as this virus or any other virus that you stay home and away from places where you may spread it to others.  You should self-quarantine for 14 days if you have symptoms that could potentially be coronavirus or have been in close contact a with a person diagnosed with COVID-19 within the last 2 weeks. You should avoid contact with people age 66 and older.   You should wear a mask or cloth face covering over your nose and mouth if you must be around other people or animals, including pets (even at home). Try to stay at least 6 feet away from other people. This will protect the people around you.  You can use  medication such as A prescription cough medication called Tessalon Perles 100 mg. You may take 1-2 capsules every 8 hours as needed for cough.  You may also take acetaminophen (Tylenol) as needed for fever.   Reduce your risk of any infection by using the same precautions used for avoiding the common cold or flu:  Marland Kitchen Wash your hands often with soap and warm water for at least 20 seconds.  If soap and water are not readily available, use an alcohol-based hand sanitizer with at least 60% alcohol.  . If coughing or sneezing, cover your mouth and nose by coughing or sneezing into the elbow areas of your shirt or coat, into a tissue or into your sleeve (not your hands). . Avoid shaking hands with others and consider head nods or verbal greetings only. . Avoid touching your eyes, nose, or mouth with unwashed hands.  . Avoid close contact with people who are sick. . Avoid places or events with large numbers of people in one location, like concerts or sporting events. . Carefully consider travel plans you have or are making. . If you are planning any travel outside or inside the [place], visit the CDC's Travelers' Health webpage for the latest health notices. . If you have some symptoms but not all symptoms, continue to monitor at home and seek medical attention if your symptoms worsen. . If you are having a medical emergency, call 911.  HOME CARE . Only take medications as instructed by your medical team. .  Drink plenty of fluids and get plenty of rest. . A steam or ultrasonic humidifier can help if you have congestion.   GET HELP RIGHT AWAY IF YOU HAVE EMERGENCY WARNING SIGNS** FOR COVID-19. If you or someone is showing any of these signs seek emergency medical care immediately. Call 911 or proceed to your closest emergency facility if: . You develop worsening high fever. . Trouble breathing . Bluish lips or face . Persistent pain or pressure in the chest . New confusion . Inability to wake or stay  awake . You cough up blood. . Your symptoms become more severe  **This list is not all possible symptoms. Contact your medical provider for any symptoms that are sever or concerning to you.   MAKE SURE YOU   Understand these instructions.  Will watch your condition.  Will get help right away if you are not doing well or get worse.  Your e-visit answers were reviewed by a board certified advanced clinical practitioner to complete your personal care plan.  Depending on the condition, your plan could have included both over the counter or prescription medications.  If there is a problem please reply once you have received a response from your provider.  Your safety is important to [place].  If you have drug allergies check your prescription carefully.    You can use MyChart to ask questions about today's visit, request a non-urgent call back, or ask for a work or school excuse for 24 hours related to this e-Visit. If it has been greater than 24 hours you will need to follow up with your provider, or enter a new e-Visit to address those concerns. You will get an e-mail in the next two days asking about your experience.  I hope that your e-visit has been valuable and will speed your recovery. Thank you for using e-visits.

## 2018-08-31 ENCOUNTER — Encounter (INDEPENDENT_AMBULATORY_CARE_PROVIDER_SITE_OTHER): Payer: Self-pay

## 2018-08-31 ENCOUNTER — Telehealth: Payer: Self-pay

## 2018-08-31 ENCOUNTER — Other Ambulatory Visit: Payer: Self-pay

## 2018-08-31 DIAGNOSIS — Z20822 Contact with and (suspected) exposure to covid-19: Secondary | ICD-10-CM

## 2018-08-31 NOTE — Telephone Encounter (Signed)
Called patient to discuss report of increased weakness and decreased appetite through MyChart.  Left HIPAA compliant VM for call back.  Also sent message through Epic.

## 2018-08-31 NOTE — Telephone Encounter (Signed)
Received call back from patient experiencing chest tightness, lack of energy, and decreased appetite.  Advised patient if increased difficulty breathing to call 911.

## 2018-09-01 ENCOUNTER — Telehealth: Payer: Self-pay | Admitting: *Deleted

## 2018-09-01 ENCOUNTER — Encounter (INDEPENDENT_AMBULATORY_CARE_PROVIDER_SITE_OTHER): Payer: Self-pay

## 2018-09-01 NOTE — Telephone Encounter (Signed)
Contacted pt due to Tricia Rodriguez response of weakness; the pt complains of fatigue; she is advised to prioritize and space activities; the pt also complains of headache and drainage down her throat; she declines taking Tylenol because her headache is not that bad; pt advised to contact Dr Sarajane Jews, [facility] for further recommendations; she verbalized understanding.

## 2018-09-02 ENCOUNTER — Encounter (INDEPENDENT_AMBULATORY_CARE_PROVIDER_SITE_OTHER): Payer: Self-pay

## 2018-09-02 ENCOUNTER — Telehealth: Payer: Self-pay | Admitting: Family Medicine

## 2018-09-02 LAB — NOVEL CORONAVIRUS, NAA: SARS-CoV-2, NAA: NOT DETECTED

## 2018-09-02 MED ORDER — AMPHETAMINE-DEXTROAMPHET ER 20 MG PO CP24: 20.0000 mg | ORAL_CAPSULE | ORAL | 0 refills | Status: DC

## 2018-09-02 NOTE — Addendum Note (Signed)
Addended by: Alysia Penna A on: 09/02/2018 04:47 PM   Modules accepted: Orders

## 2018-09-02 NOTE — Telephone Encounter (Signed)
Medication Refill - Medication: amphetamine-dextroamphetamine (ADDERALL XR) 20 MG 24 hr capsule   Has the patient contacted their pharmacy? Yes (Agent: If no, request that the patient contact the pharmacy for the refill.) (Agent: If yes, when and what did the pharmacy advise?)Contact PCP  Preferred Pharmacy (with phone number or street name):  [id number] - [place], [place] - [address] [place] [address] [place] [phone] (Phone) [phone] (Fax)     Agent: Please be advised that RX refills may take up to 3 business days. We ask that you follow-up with your pharmacy.

## 2018-09-02 NOTE — Telephone Encounter (Signed)
Done

## 2018-09-03 ENCOUNTER — Telehealth: Payer: Self-pay

## 2018-09-03 ENCOUNTER — Encounter (INDEPENDENT_AMBULATORY_CARE_PROVIDER_SITE_OTHER): Payer: Self-pay

## 2018-09-03 DIAGNOSIS — Z0000 Encounter for general adult medical examination without abnormal findings: Secondary | ICD-10-CM

## 2018-09-03 DIAGNOSIS — Z1211 Encounter for screening for malignant neoplasm of colon: Secondary | ICD-10-CM

## 2018-09-03 NOTE — Telephone Encounter (Signed)
Spoke with the patients pharmacy, they do have her prescriptions. Patient is aware.

## 2018-09-03 NOTE — Telephone Encounter (Signed)
Patient is stating that her referral for her colonoscopy was supposed to go to [facility] but she received a call from [facility]. The referral is now closed.  Ok to place new referral?

## 2018-09-07 NOTE — Telephone Encounter (Signed)
Referral has been placed. Patient is aware.  

## 2018-09-07 NOTE — Telephone Encounter (Signed)
Yes please do so 

## 2018-09-25 ENCOUNTER — Encounter: Payer: Self-pay | Admitting: Family Medicine

## 2018-09-29 ENCOUNTER — Ambulatory Visit: Payer: Self-pay

## 2018-09-29 ENCOUNTER — Ambulatory Visit (INDEPENDENT_AMBULATORY_CARE_PROVIDER_SITE_OTHER): Payer: BC Managed Care – PPO | Admitting: Family Medicine

## 2018-09-29 ENCOUNTER — Encounter: Payer: Self-pay | Admitting: Family Medicine

## 2018-09-29 DIAGNOSIS — M25551 Pain in right hip: Secondary | ICD-10-CM

## 2018-09-29 DIAGNOSIS — R200 Anesthesia of skin: Secondary | ICD-10-CM | POA: Diagnosis not present

## 2018-09-29 DIAGNOSIS — L608 Other nail disorders: Secondary | ICD-10-CM

## 2018-09-29 DIAGNOSIS — M2550 Pain in unspecified joint: Secondary | ICD-10-CM

## 2018-09-29 DIAGNOSIS — M5441 Lumbago with sciatica, right side: Secondary | ICD-10-CM

## 2018-09-29 DIAGNOSIS — G8929 Other chronic pain: Secondary | ICD-10-CM

## 2018-09-29 DIAGNOSIS — R202 Paresthesia of skin: Secondary | ICD-10-CM

## 2018-09-29 MED ORDER — NABUMETONE 750 MG PO TABS: 750.0000 mg | ORAL_TABLET | Freq: Two times a day (BID) | ORAL | 6 refills | Status: DC | PRN

## 2018-09-29 MED ORDER — GABAPENTIN 100 MG PO CAPS: ORAL_CAPSULE | ORAL | 3 refills | Status: DC

## 2018-09-29 NOTE — Addendum Note (Signed)
Addended by: Hortencia Pilar on: 09/29/2018 09:29 AM   Modules accepted: Orders

## 2018-09-29 NOTE — Progress Notes (Signed)
Office Visit Note   Patient: Tricia Rodriguez           Date of Birth: Oct 20, 1970           MRN: [id number] Visit Date: 09/29/2018 Requested by: Laurey Morale, MD [place],  [place] [id number] PCP: Laurey Morale, MD  Subjective: Chief Complaint  Patient presents with  . numbness right side of body x 5 weeks    HPI: She is here with right arm and leg numbness.  About 5 or 6 weeks ago she was rearranging some rugs.  She tried to move a heavy rug and felt a twinge of pain in the right lower back.  It was not enough to keep her from doing any activities.  She continues with her normal workout routine but then started developing numbness in her entire leg.  She has not noticed any weakness but the numbness is constant.  She began seeing a chiropractor but the symptoms did not change.  In fact now she is noticing numbness in her right arm.  She is right-hand dominant.  She has a history of cervical degenerative disc disease diagnosed by x-rays in the past but her neck really does not bother her.  There is been no other change in history, she is not on any new prescription medicines.  She recently had a wellness exam and labs were normal per her report.  She denies any headaches, visual change.  There is a family history of arthritic conditions but no other major family history that she knows of.               ROS: Denies fevers or chills.  All other systems were reviewed and are negative.  Objective: Vital Signs: There were no vitals taken for this visit.  Physical Exam:  General:  Alert and oriented, in no acute distress. Pulm:  Breathing unlabored. Psy:  Normal mood, congruent affect. Skin: No visible rash. Neck: Full range of motion with negative Spurling's test.  Upper extremity strength and reflexes are normal. Right arm: Negative Tinel's at the ulnar groove, no subluxation of the ulnar nerve.  Positive Tinel's at the carpal tunnel and positive Phalen's test  reproducing her hand numbness. Low back: No scoliosis, leg lengths are equal.  She has limited internal rotation of both hips to about 20 degrees and external rotation is good at about 60 degrees.  She does have some pain in both hips with passive internal rotation.  Lower extremity strength and reflexes are normal.  Light touch sensation is normal.  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  X-rays lumbar spine:  L4-5 and L5-S1 mild-moderate DDD.  X-rays both hips:  Bilateral mild cam deformities.  Calcific density lateral to left acetabulum, possible old labrum injury.    Assessment & Plan: 1.  Right leg numbness, suspect lumbar disc protrusion. - MRI to evaluate.  EMG/NCS if unrevealing. - Gabapentin. - Additional labs.  2.  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  Cannot rule out cervical source. - Nerve conduction studies.  Possible injection if indicated.       Procedures: No procedures performed  No notes on file     PMFS History: Patient Active Problem List   Diagnosis Date Noted  . Anxiety, generalized 09/15/2017  . 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 Anemia, iron deficiency 09/07/2014   Past Medical History:  Diagnosis Date  . Anemia   .  Menorrhagia   . PONV (postoperative nausea and vomiting)   . Uterine fibroid   . Wears contact lenses     Family History  Problem Relation Age of Onset  . Alcohol abuse Mother   . Arthritis Mother   . Hyperlipidemia Father   . Hypertension Father   . Diabetes Maternal Grandmother   . Alcohol abuse Maternal Grandfather   . Diabetes Paternal Grandmother     Past Surgical History:  Procedure Laterality Date  . DILATION AND CURETTAGE OF UTERUS  2000  . DILITATION & CURRETTAGE/HYSTROSCOPY WITH NOVASURE ABLATION N/A 01/23/2015   Procedure: DILATATION & CURETTAGE/HYSTEROSCOPY WITH RESECTOSCOPE;  Surgeon: Olga Millers, MD;  Location: [facility];  Service: Gynecology;  Laterality: N/A;  . KNEE SURGERY Bilateral 1980's  . SHOULDER SURGERY    . WRIST SURGERY  1999   Social History   Occupational History  . Not on file  Tobacco Use  . Smoking status: Never Smoker  . Smokeless tobacco: Never Used  Substance and Sexual Activity  . Alcohol use: Yes    Alcohol/week: 0.0 standard drinks    Comment: occasional  . Drug use: No  . Sexual activity: Not on file

## 2018-09-30 ENCOUNTER — Telehealth: Payer: Self-pay | Admitting: Family Medicine

## 2018-09-30 NOTE — Telephone Encounter (Signed)
Vitamin D is slightly low (target level is 50-80).  I recommend taking Vitamin D3 at 2,000 IU daily.  Would recheck in 6-12 months.  Others normal so far.

## 2018-10-01 ENCOUNTER — Telehealth: Payer: Self-pay | Admitting: Family Medicine

## 2018-10-01 DIAGNOSIS — E559 Vitamin D deficiency, unspecified: Secondary | ICD-10-CM

## 2018-10-01 LAB — HIGH SENSITIVITY CRP: hs-CRP: <0.3 mg/L

## 2018-10-01 LAB — THYROID PEROXIDASE ANTIBODY: Thyroperoxidase Ab SerPl-aCnc: <1 IU/mL (ref <9)

## 2018-10-01 LAB — CYCLIC CITRUL PEPTIDE ANTIBODY, IGG: Cyclic Citrullin Peptide Ab: <16 UNITS

## 2018-10-01 LAB — RHEUMATOID FACTOR: Rheumatoid fact SerPl-aCnc: <14 IU/mL (ref <14)

## 2018-10-01 LAB — ANTI-NUCLEAR AB-TITER (ANA TITER): ANA Titer 1: 1:80 {titer} — ABNORMAL HIGH

## 2018-10-01 LAB — VITAMIN D 25 HYDROXY (VIT D DEFICIENCY, FRACTURES): Vit D, 25-Hydroxy: 31 ng/mL (ref 30–100)

## 2018-10-01 LAB — ANA: Anti Nuclear Antibody (ANA): POSITIVE — AB

## 2018-10-01 LAB — URIC ACID: Uric Acid, Serum: 2.7 mg/dL (ref 2.5–7.0)

## 2018-10-01 NOTE — Telephone Encounter (Signed)
ANA is positive with a titer of 1:80 and a speckled pattern.

## 2018-10-05 NOTE — Addendum Note (Signed)
Addended by: Hortencia Pilar on: 10/05/2018 07:59 AM   Modules accepted: Orders

## 2018-10-10 ENCOUNTER — Ambulatory Visit
Admission: RE | Admit: 2018-10-10 | Discharge: 2018-10-10 | Disposition: A | Discharge status: Home / Self Care | Payer: BC Managed Care – PPO | Source: Ambulatory Visit | Attending: Family Medicine | Admitting: Family Medicine

## 2018-10-10 ENCOUNTER — Other Ambulatory Visit: Payer: Self-pay

## 2018-10-10 DIAGNOSIS — R200 Anesthesia of skin: Secondary | ICD-10-CM

## 2018-10-10 DIAGNOSIS — G8929 Other chronic pain: Secondary | ICD-10-CM

## 2018-10-13 ENCOUNTER — Telehealth: Payer: Self-pay | Admitting: Family Medicine

## 2018-10-13 DIAGNOSIS — R200 Anesthesia of skin: Secondary | ICD-10-CM

## 2018-10-13 DIAGNOSIS — G8929 Other chronic pain: Secondary | ICD-10-CM

## 2018-10-13 DIAGNOSIS — R202 Paresthesia of skin: Secondary | ICD-10-CM

## 2018-10-13 DIAGNOSIS — M2550 Pain in unspecified joint: Secondary | ICD-10-CM

## 2018-10-13 NOTE — Telephone Encounter (Signed)
MRI shows a disc protrusion at L4-5 which causes narrowing of the nerve openings on both sides, but no nerve compression while you were lying in the scanner.  This could potentially cause pain and occasional sciatica pain, depending on positioning.

## 2018-10-13 NOTE — Addendum Note (Signed)
Addended by: Hortencia Pilar on: 10/13/2018 08:32 AM   Modules accepted: Orders

## 2018-10-15 ENCOUNTER — Other Ambulatory Visit: Payer: Self-pay | Admitting: Physical Medicine and Rehabilitation

## 2018-10-15 ENCOUNTER — Telehealth: Payer: Self-pay | Admitting: *Deleted

## 2018-10-15 DIAGNOSIS — F411 Generalized anxiety disorder: Secondary | ICD-10-CM

## 2018-10-15 MED ORDER — DIAZEPAM 5 MG PO TABS: ORAL_TABLET | ORAL | 0 refills | Status: DC

## 2018-10-15 NOTE — Progress Notes (Signed)
Pre-procedure diazepam ordered for pre-operative anxiety.  

## 2018-10-15 NOTE — Telephone Encounter (Signed)
Ok done

## 2018-10-16 ENCOUNTER — Encounter: Payer: Self-pay | Admitting: Physical Medicine and Rehabilitation

## 2018-10-16 ENCOUNTER — Ambulatory Visit (INDEPENDENT_AMBULATORY_CARE_PROVIDER_SITE_OTHER): Payer: BC Managed Care – PPO | Admitting: Physical Medicine and Rehabilitation

## 2018-10-16 DIAGNOSIS — R202 Paresthesia of skin: Secondary | ICD-10-CM | POA: Diagnosis not present

## 2018-10-16 NOTE — Progress Notes (Signed)
 .  Numeric Pain Rating Scale and Functional Assessment Average Pain 7   In the last MONTH (on 0-10 scale) has pain interfered with the following?  1. General activity like being  able to carry out your everyday physical activities such as walking, climbing stairs, carrying groceries, or moving a chair?  Rating(6)     

## 2018-10-19 NOTE — Procedures (Signed)
EMG & NCV Findings: (Please note that the right fibular and tibial nerve conductions were missed labeled and are actually in reverse as noted in the charts) Evaluation of the right median motor nerve showed prolonged distal onset latency (4.3 ms) and decreased conduction velocity (Elbow-Wrist, 49 m/s).  The right tibial motor nerve showed reduced amplitude (1.7 mV).  The right median (across palm) sensory nerve showed prolonged distal peak latency (Wrist, 4.4 ms) and prolonged distal peak latency (Palm, 2.4 ms).  The right ulnar sensory nerve showed prolonged distal peak latency (3.8 ms) and decreased conduction velocity (Wrist-5th Digit, 37 m/s).  All remaining nerves (as indicated in the following tables) were within normal limits.    All examined muscles (as indicated in the following table) showed no evidence of electrical instability.    Impression: The above electrodiagnostic study is ABNORMAL and reveals evidence of a moderate right median nerve entrapment at the wrist (carpal tunnel syndrome) affecting sensory and motor components.   There is no significant electrodiagnostic evidence of any other focal nerve entrapment, brachial plexopathy, cervical radiculopathy, lumbosacral plexopathy, lumbar radiculopathy in the right upper limb or in the right lower limb.  As you know, this particular electrodiagnostic study cannot rule out chemical radiculitis or sensory only radiculopathy.   **This electrodiagnostic study cannot rule out small fiber polyneuropathy and dysesthesias from central pain syndromes such as stroke or central pain sensitization syndromes such as fibromyalgia.  Myotomal referral pain from trigger points is also not excluded.  Recommendations:(Please note that the right fibular and tibial nerve conductions were missed labeled and are actually in reverse as noted in the charts) 1.  Follow-up with referring physician. 2.  Continue current management of symptoms. 3.  Suggest use of  resting splint at night-time and as needed during the day. 4.  Suggest ultrasound hydrodissection/injection for right carpal tunnel with potential surgical evaluation. 5.  Suggest right L5 transforaminal epidural steroid injection diagnostically and therapeutically.  ___________________________ Laurence Spates FAAPMR Board Certified, American Board of Physical Medicine and Rehabilitation    Nerve Conduction Studies Anti Sensory Summary Table   Stim Site NR Peak (ms) Norm Peak (ms) P-T Amp (V) Norm P-T Amp Site1 Site2 Delta-P (ms) Dist (cm) Vel (m/s) Norm Vel (m/s)  Right Median Acr Palm Anti Sensory (2nd Digit)  32C  Wrist    *4.4 <3.6 20.0 >10 Wrist Palm 2.0 0.0    Palm    *2.4 <2.0 51.9         Right Radial Anti Sensory (Base 1st Digit)  31C  Wrist    2.4 <3.1 26.5  Wrist Base 1st Digit 2.4 0.0    Right Sup Fibular Anti Sensory (Ant Lat Mall)  27.2C  14 cm    3.8 <4.4 18.2 >5.0 14 cm Ant Lat Mall 3.8 14.0 37 >32  Right Sural Anti Sensory (Lat Mall)  27.1C  Calf    3.8 <4.0 15.5 >5.0 Calf Lat Mall 3.8 14.0 37 >35  Right Ulnar Anti Sensory (5th Digit)  31.8C  Wrist    *3.8 <3.7 45.2 >15.0 Wrist 5th Digit 3.8 14.0 *37 >38   Motor Summary Table   Stim Site NR Onset (ms) Norm Onset (ms) O-P Amp (mV) Norm O-P Amp Site1 Site2 Delta-0 (ms) Dist (cm) Vel (m/s) Norm Vel (m/s)  Right Fibular Motor (Ext Dig Brev)  27.2C    **actually right Tibial  Ankle    5.0 <6.1 12.3 >2.5 B Fib Ankle 7.5 30.0 40 >38  B Fib  12.5  11.1         Right Median Motor (Abd Poll Brev)  30.8C  Wrist    *4.3 <4.2 5.2 >5 Elbow Wrist 3.5 17.0 *49 >50  Elbow    7.8  8.9         Right Tibial Motor (Abd Hall Brev)  27.3C    **actually right Fibular  Ankle    4.3 <6.1 *1.7 >3.0 Knee Ankle 6.2 28.0 45 >35  Knee    10.5  1.5  Knee Site 3 1.8 10.0 56   Site 3    12.3  1.6         Right Ulnar Motor (Abd Dig Min)  30.7C  Wrist    3.6 <4.2 9.9 >3 B Elbow Wrist 3.0 17.5 58 >53  B Elbow    6.6  10.4  A Elbow B  Elbow 1.4 10.0 71 >53  A Elbow    8.0  10.1          EMG   Side Muscle Nerve Root Ins Act Fibs Psw Amp Dur Poly Recrt Int Fraser Din Comment  Right Abd Poll Brev Median C8-T1 Nml Nml Nml Nml Nml 0 Nml Nml   Right 1stDorInt Ulnar C8-T1 Nml Nml Nml Nml Nml 0 Nml Nml   Right PronatorTeres Median C6-7 Nml Nml Nml Nml Nml 0 Nml Nml   Right Biceps Musculocut C5-6 Nml Nml Nml Nml Nml 0 Nml Nml   Right Deltoid Axillary C5-6 Nml Nml Nml Nml Nml 0 Nml Nml   Right AntTibialis Dp Br Peron L4-5 Nml Nml Nml Nml Nml 0 Nml Nml   Right Fibularis Longus  Sup Br Peron L5-S1 Nml Nml Nml Nml Nml 0 Nml Nml   Right MedGastroc Tibial S1-2 Nml Nml Nml Nml Nml 0 Nml Nml   Right VastusMed Femoral L2-4 Nml Nml Nml Nml Nml 0 Nml Nml   Right BicepsFemS Sciatic L5-S1 Nml Nml Nml Nml Nml 0 Nml Nml     Nerve Conduction Studies Anti Sensory Left/Right Comparison   Stim Site L Lat (ms) R Lat (ms) L-R Lat (ms) L Amp (V) R Amp (V) L-R Amp (%) Site1 Site2 L Vel (m/s) R Vel (m/s) L-R Vel (m/s)  Median Acr Palm Anti Sensory (2nd Digit)  32C  Wrist  *4.4   20.0  Wrist Palm     Palm  *2.4   51.9        Radial Anti Sensory (Base 1st Digit)  31C  Wrist  2.4   26.5  Wrist Base 1st Digit     Sup Fibular Anti Sensory (Ant Lat Mall)  27.2C  14 cm  3.8   18.2  14 cm Ant Lat Mall  37   Sural Anti Sensory (Lat Mall)  27.1C  Calf  3.8   15.5  Calf Lat Mall  37   Ulnar Anti Sensory (5th Digit)  31.8C  Wrist  *3.8   45.2  Wrist 5th Digit  *37    Motor Left/Right Comparison   Stim Site L Lat (ms) R Lat (ms) L-R Lat (ms) L Amp (mV) R Amp (mV) L-R Amp (%) Site1 Site2 L Vel (m/s) R Vel (m/s) L-R Vel (m/s)  Fibular Motor (Ext Dig Brev)  27.2C    actually right Tibial  Ankle  5.0   12.3  B Fib Ankle  40   B Fib  12.5   11.1        Median Motor (Abd Poll Brev)  30.8C  Wrist  *4.3   5.2  Elbow Wrist  *49   Elbow  7.8   8.9        Tibial Motor (Abd Hall Brev)  27.3C    actually right Fibular  Ankle  4.3   *1.7  Knee Ankle  45    Knee  10.5   1.5  Knee Site 3  56   Site 3  12.3   1.6        Ulnar Motor (Abd Dig Min)  30.7C  Wrist  3.6   9.9  B Elbow Wrist  58   B Elbow  6.6   10.4  A Elbow B Elbow  71   A Elbow  8.0   10.1           Waveforms:

## 2018-10-19 NOTE — Progress Notes (Signed)
Tricia Rodriguez - 48 y.o. female MRN [id number]  Date of birth: 09-18-1970  Office Visit Note: Visit Date: 10/16/2018 PCP: Laurey Morale, MD Referred by: Laurey Morale, MD  Subjective: Chief Complaint  Patient presents with  . Right Arm - Numbness  . Right Leg - Numbness   HPI: Tricia Rodriguez is a 48 y.o. female who comes in today At the request of Dr. Legrand Como hilts for electrodiagnostic study of the right upper and lower limbs.  Patient is a right-hand-dominant female who states that probably 7 to 8 weeks ago she was doing some activity at home and was moving some rugs and felt a twinge of pain in the right lower back.  At first it was nothing that would really keep her from doing daily activities but she began having numbness.  The numbness extending in the global right leg and foot.  She denies any specific weakness.  She has had medication management and chiropractic care without relief.  She reports the symptoms are constant numbness in the leg.  She is very worried about the numbness.  She denies any left-sided complaints.  When really asking her specifically it does seem to be more of a posterior lateral distribution.  She has had MRI of the lumbar spine which is reviewed below.  She does have disc protrusion with right more than left lateral recess narrowing at L4-5.  She has no focal nerve compression on the MRI.  She also states that now the right arm and hand are completely numb constantly.  She denies any specific neck pain.  She denies any left-sided complaints.  She has not had any prior electrodiagnostic studies.  Her case is complicated by generalized anxiety disorder.  ROS Otherwise per HPI.  Assessment & Plan: Visit Diagnoses:  1. Paresthesia of skin     Plan: Impression: The above electrodiagnostic study is ABNORMAL and reveals evidence of a moderate right median nerve entrapment at the wrist (carpal tunnel syndrome) affecting sensory and motor components.    There is no significant electrodiagnostic evidence of any other focal nerve entrapment, brachial plexopathy, cervical radiculopathy, lumbosacral plexopathy, lumbar radiculopathy in the right upper limb or in the right lower limb.  As you know, this particular electrodiagnostic study cannot rule out chemical radiculitis or sensory only radiculopathy.   **This electrodiagnostic study cannot rule out small fiber polyneuropathy and dysesthesias from central pain syndromes such as stroke or central pain sensitization syndromes such as fibromyalgia.  Myotomal referral pain from trigger points is also not excluded.  Recommendations:(Please note that the right fibular and tibial nerve conductions were missed labeled and are actually in reverse as noted in the charts) 1.  Follow-up with referring physician. 2.  Continue current management of symptoms. 3.  Suggest use of resting splint at night-time and as needed during the day. 4.  Suggest ultrasound hydrodissection/injection for right carpal tunnel with potential surgical evaluation. 5.  Suggest right L5 transforaminal epidural steroid injection diagnostically and therapeutically.   Meds & Orders: No orders of the defined types were placed in this encounter.   Orders Placed This Encounter  Procedures  . NCV with EMG (electromyography)    Follow-up: Return for Eunice Blase, MD.   Procedures: No procedures performed  EMG & NCV Findings: (Please note that the right fibular and tibial nerve conductions were missed labeled and are actually in reverse as noted in the charts) Evaluation of the right median motor nerve showed prolonged distal onset latency (4.3  ms) and decreased conduction velocity (Elbow-Wrist, 49 m/s).  The right tibial motor nerve showed reduced amplitude (1.7 mV).  The right median (across palm) sensory nerve showed prolonged distal peak latency (Wrist, 4.4 ms) and prolonged distal peak latency (Palm, 2.4 ms).  The right ulnar sensory  nerve showed prolonged distal peak latency (3.8 ms) and decreased conduction velocity (Wrist-5th Digit, 37 m/s).  All remaining nerves (as indicated in the following tables) were within normal limits.    All examined muscles (as indicated in the following table) showed no evidence of electrical instability.    Impression: The above electrodiagnostic study is ABNORMAL and reveals evidence of a moderate right median nerve entrapment at the wrist (carpal tunnel syndrome) affecting sensory and motor components.   There is no significant electrodiagnostic evidence of any other focal nerve entrapment, brachial plexopathy, cervical radiculopathy, lumbosacral plexopathy, lumbar radiculopathy in the right upper limb or in the right lower limb.  As you know, this particular electrodiagnostic study cannot rule out chemical radiculitis or sensory only radiculopathy.   **This electrodiagnostic study cannot rule out small fiber polyneuropathy and dysesthesias from central pain syndromes such as stroke or central pain sensitization syndromes such as fibromyalgia.  Myotomal referral pain from trigger points is also not excluded.  Recommendations:(Please note that the right fibular and tibial nerve conductions were missed labeled and are actually in reverse as noted in the charts) 1.  Follow-up with referring physician. 2.  Continue current management of symptoms. 3.  Suggest use of resting splint at night-time and as needed during the day. 4.  Suggest ultrasound hydrodissection/injection for right carpal tunnel with potential surgical evaluation. 5.  Suggest right L5 transforaminal epidural steroid injection diagnostically and therapeutically.  ___________________________ Laurence Spates FAAPMR Board Certified, American Board of Physical Medicine and Rehabilitation    Nerve Conduction Studies Anti Sensory Summary Table   Stim Site NR Peak (ms) Norm Peak (ms) P-T Amp (V) Norm P-T Amp Site1 Site2 Delta-P  (ms) Dist (cm) Vel (m/s) Norm Vel (m/s)  Right Median Acr Palm Anti Sensory (2nd Digit)  32C  Wrist    *4.4 <3.6 20.0 >10 Wrist Palm 2.0 0.0    Palm    *2.4 <2.0 51.9         Right Radial Anti Sensory (Base 1st Digit)  31C  Wrist    2.4 <3.1 26.5  Wrist Base 1st Digit 2.4 0.0    Right Sup Fibular Anti Sensory (Ant Lat Mall)  27.2C  14 cm    3.8 <4.4 18.2 >5.0 14 cm Ant Lat Mall 3.8 14.0 37 >32  Right Sural Anti Sensory (Lat Mall)  27.1C  Calf    3.8 <4.0 15.5 >5.0 Calf Lat Mall 3.8 14.0 37 >35  Right Ulnar Anti Sensory (5th Digit)  31.8C  Wrist    *3.8 <3.7 45.2 >15.0 Wrist 5th Digit 3.8 14.0 *37 >38   Motor Summary Table   Stim Site NR Onset (ms) Norm Onset (ms) O-P Amp (mV) Norm O-P Amp Site1 Site2 Delta-0 (ms) Dist (cm) Vel (m/s) Norm Vel (m/s)  Right Fibular Motor (Ext Dig Brev)  27.2C    **actually right Tibial  Ankle    5.0 <6.1 12.3 >2.5 B Fib Ankle 7.5 30.0 40 >38  B Fib    12.5  11.1         Right Median Motor (Abd Poll Brev)  30.8C  Wrist    *4.3 <4.2 5.2 >5 Elbow Wrist 3.5 17.0 *49 >50  Elbow  7.8  8.9         Right Tibial Motor (Abd Hall Brev)  27.3C    **actually right Fibular  Ankle    4.3 <6.1 *1.7 >3.0 Knee Ankle 6.2 28.0 45 >35  Knee    10.5  1.5  Knee Site 3 1.8 10.0 56   Site 3    12.3  1.6         Right Ulnar Motor (Abd Dig Min)  30.7C  Wrist    3.6 <4.2 9.9 >3 B Elbow Wrist 3.0 17.5 58 >53  B Elbow    6.6  10.4  A Elbow B Elbow 1.4 10.0 71 >53  A Elbow    8.0  10.1          EMG   Side Muscle Nerve Root Ins Act Fibs Psw Amp Dur Poly Recrt Int Fraser Din Comment  Right Abd Poll Brev Median C8-T1 Nml Nml Nml Nml Nml 0 Nml Nml   Right 1stDorInt Ulnar C8-T1 Nml Nml Nml Nml Nml 0 Nml Nml   Right PronatorTeres Median C6-7 Nml Nml Nml Nml Nml 0 Nml Nml   Right Biceps Musculocut C5-6 Nml Nml Nml Nml Nml 0 Nml Nml   Right Deltoid Axillary C5-6 Nml Nml Nml Nml Nml 0 Nml Nml   Right AntTibialis Dp Br Peron L4-5 Nml Nml Nml Nml Nml 0 Nml Nml   Right Fibularis  Longus  Sup Br Peron L5-S1 Nml Nml Nml Nml Nml 0 Nml Nml   Right MedGastroc Tibial S1-2 Nml Nml Nml Nml Nml 0 Nml Nml   Right VastusMed Femoral L2-4 Nml Nml Nml Nml Nml 0 Nml Nml   Right BicepsFemS Sciatic L5-S1 Nml Nml Nml Nml Nml 0 Nml Nml     Nerve Conduction Studies Anti Sensory Left/Right Comparison   Stim Site L Lat (ms) R Lat (ms) L-R Lat (ms) L Amp (V) R Amp (V) L-R Amp (%) Site1 Site2 L Vel (m/s) R Vel (m/s) L-R Vel (m/s)  Median Acr Palm Anti Sensory (2nd Digit)  32C  Wrist  *4.4   20.0  Wrist Palm     Palm  *2.4   51.9        Radial Anti Sensory (Base 1st Digit)  31C  Wrist  2.4   26.5  Wrist Base 1st Digit     Sup Fibular Anti Sensory (Ant Lat Mall)  27.2C  14 cm  3.8   18.2  14 cm Ant Lat Mall  37   Sural Anti Sensory (Lat Mall)  27.1C  Calf  3.8   15.5  Calf Lat Mall  37   Ulnar Anti Sensory (5th Digit)  31.8C  Wrist  *3.8   45.2  Wrist 5th Digit  *37    Motor Left/Right Comparison   Stim Site L Lat (ms) R Lat (ms) L-R Lat (ms) L Amp (mV) R Amp (mV) L-R Amp (%) Site1 Site2 L Vel (m/s) R Vel (m/s) L-R Vel (m/s)  Fibular Motor (Ext Dig Brev)  27.2C    actually right Tibial  Ankle  5.0   12.3  B Fib Ankle  40   B Fib  12.5   11.1        Median Motor (Abd Poll Brev)  30.8C  Wrist  *4.3   5.2  Elbow Wrist  *49   Elbow  7.8   8.9        Tibial Motor (Abd Hall Brev)  27.3C    actually  right Fibular  Ankle  4.3   *1.7  Knee Ankle  45   Knee  10.5   1.5  Knee Site 3  56   Site 3  12.3   1.6        Ulnar Motor (Abd Dig Min)  30.7C  Wrist  3.6   9.9  B Elbow Wrist  58   B Elbow  6.6   10.4  A Elbow B Elbow  71   A Elbow  8.0   10.1           Waveforms:                  Clinical History: EXAM: MRI LUMBAR SPINE WITHOUT CONTRAST  TECHNIQUE: Multiplanar, multisequence MR imaging of the lumbar spine was performed. No intravenous contrast was administered.  COMPARISON:  X-ray 09/29/2018  FINDINGS: Segmentation:  Standard.  Alignment:   Physiologic.  Vertebrae:  No fracture, evidence of discitis, or bone lesion.  Conus medullaris and cauda equina: Conus extends to the L1 level. Conus and cauda equina appear normal.  Paraspinal and other soft tissues: Negative.  Disc levels:  T12-L1: No significant disc protrusion, foraminal stenosis, or canal stenosis.  L1-L2: No significant disc protrusion, foraminal stenosis, or canal stenosis.  L2-L3: No significant disc protrusion, foraminal stenosis, or canal stenosis.  L3-L4: No significant disc protrusion, foraminal stenosis, or canal stenosis.  L4-L5: Disc desiccation with focal posterior disc protrusion resulting in impress upon the ventral thecal sac, right greater than left subarticular recess narrowing, and mild canal stenosis. Mild bilateral facet arthrosis contribute to mild bilateral foraminal stenosis.  L5-S1: Mild bilateral facet arthrosis. No significant disc protrusion, foraminal stenosis, or canal stenosis.  IMPRESSION: Lower lumbar spondylosis most pronounced at L4-5 where a posterior disc protrusion and bilateral facet arthrosis contribute to mild bilateral foraminal stenosis, bilateral subarticular recess stenosis, and mild canal stenosis.   Electronically Signed   By: Davina Poke M.D.   On: 10/10/2018 17:14   She reports that she has never smoked. She has never used smokeless tobacco.  Recent Labs    09/29/18 0927  LABURIC 2.7    Objective:  VS:  HT:    WT:   BMI:     BP:   HR: bpm  TEMP: ( )  RESP:  Physical Exam Musculoskeletal:        General: No swelling, tenderness or deformity.     Comments: Inspection reveals no atrophy of the bilateral APB or FDI or hand intrinsics.  There is some mild atrophy of the bilateral EDB.  There is no swelling, color changes, allodynia or dystrophic changes. There is 5 out of 5 strength in the bilateral wrist extension, finger abduction and long finger flexion.  Patient ambulates  without aid with no foot drop.  There is subjective dysesthesias nondermatomal in the right hand and right leg. There is a negative Hoffmann's test bilaterally.  Skin:    General: Skin is warm and dry.     Findings: No erythema or rash.  Neurological:     General: No focal deficit present.     Mental Status: She is alert and oriented to person, place, and time.     Motor: No weakness or abnormal muscle tone.     Coordination: Coordination normal.  Psychiatric:        Mood and Affect: Mood normal.        Behavior: Behavior normal.     Ortho Exam Imaging: No results found.  Past Medical/Family/Surgical/Social History: Medications & Allergies reviewed per EMR, new medications updated. Patient Active Problem List   Diagnosis Date Noted  . Anxiety, generalized 09/15/2017  . 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 Anemia, iron deficiency 09/07/2014   Past Medical History:  Diagnosis Date  . Anemia   . Menorrhagia   . PONV (postoperative nausea and vomiting)   . Uterine fibroid   . Wears contact lenses    Family History  Problem Relation Age of Onset  . Alcohol abuse Mother   . Arthritis Mother   . Hyperlipidemia Father   . Hypertension Father   . Diabetes Maternal Grandmother   . Alcohol abuse Maternal Grandfather   . Diabetes Paternal Grandmother    Past Surgical History:  Procedure Laterality Date  . DILATION AND CURETTAGE OF UTERUS  2000  . DILITATION & CURRETTAGE/HYSTROSCOPY WITH NOVASURE ABLATION N/A 01/23/2015   Procedure: DILATATION & CURETTAGE/HYSTEROSCOPY WITH RESECTOSCOPE;  Surgeon: Olga Millers, MD;  Location: [facility];  Service: Gynecology;  Laterality: N/A;  . KNEE SURGERY Bilateral 1980's  . SHOULDER SURGERY    . WRIST SURGERY  1999   Social History   Occupational History  . Not on file  Tobacco Use  . Smoking status: Never Smoker  . Smokeless tobacco: Never Used  Substance  and Sexual Activity  . Alcohol use: Yes    Alcohol/week: 0.0 standard drinks    Comment: occasional  . Drug use: No  . Sexual activity: Not on file

## 2018-10-20 ENCOUNTER — Other Ambulatory Visit: Payer: Self-pay

## 2018-10-20 ENCOUNTER — Ambulatory Visit (INDEPENDENT_AMBULATORY_CARE_PROVIDER_SITE_OTHER): Payer: BC Managed Care – PPO | Admitting: Family Medicine

## 2018-10-20 ENCOUNTER — Encounter: Payer: Self-pay | Admitting: Family Medicine

## 2018-10-20 DIAGNOSIS — G5601 Carpal tunnel syndrome, right upper limb: Secondary | ICD-10-CM | POA: Diagnosis not present

## 2018-10-20 DIAGNOSIS — R202 Paresthesia of skin: Secondary | ICD-10-CM | POA: Diagnosis not present

## 2018-10-20 DIAGNOSIS — R200 Anesthesia of skin: Secondary | ICD-10-CM

## 2018-10-20 NOTE — Progress Notes (Signed)
Office Visit Note   Patient: Tricia Rodriguez           Date of Birth: 01-05-1971           MRN: [id number] Visit Date: 10/20/2018 Requested by: Laurey Morale, MD [place],  [place] [id number] PCP: Laurey Morale, MD  Subjective: Chief Complaint  Patient presents with  . f/u post NCS rue, rle - face is numb    HPI: She is here to discuss nerve conduction studies.  Lower extremity studies were normal, upper extremity showed moderate right carpal tunnel syndrome.  She continues to have right arm numbness and bilateral leg numbness to the lateral aspect of her feet, and now she is having intermittent facial numbness.  She is very concerned about all of her symptoms.  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  She is finding it difficult to make these dietary changes.  She is very concerned about all of her issues.              ROS: Denies headaches or visual disturbance.  All other systems were reviewed and are negative.  Objective: Vital Signs: There were no vitals taken for this visit.  Physical Exam:  General:  Alert and oriented, in no acute distress. Pulm:  Breathing unlabored. Psy:  Normal mood, congruent affect. Skin: No rash today. Right wrist: Negative Tinel's carpal tunnel, equivocal Phalen's test.  No thenar atrophy.  Imaging: None today.  Assessment & Plan: 1.  Right carpal tunnel syndrome -Discussed options with her and she wants to try an injection.  She would also like referral to Dr. Amedeo Plenty for consideration of carpal tunnel release.  2.  Facial and lower extremity paresthesias, etiology uncertain. -Brain MRI scan to evaluate.     Procedures: Ultrasound-guided right carpal tunnel injection: After sterile prep with Betadine, injected 1 cc 1% lidocaine without epinephrine and 20 mg methylprednisolone.  She tolerated this without immediate complication.    PMFS History: Patient Active Problem List   Diagnosis  Date Noted  . Anxiety, generalized 09/15/2017  . Menopausal symptom 06/16/2017  . Menorrhagia 06/16/2017  . 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 Anemia, iron deficiency 09/07/2014   Past Medical History:  Diagnosis Date  . Anemia   . Menorrhagia   . PONV (postoperative nausea and vomiting)   . Uterine fibroid   . Wears contact lenses     Family History  Problem Relation Age of Onset  . Alcohol abuse Mother   . Arthritis Mother   . Hyperlipidemia Father   . Hypertension Father   . Diabetes Maternal Grandmother   . Alcohol abuse Maternal Grandfather   . Diabetes Paternal Grandmother     Past Surgical History:  Procedure Laterality Date  . DILATION AND CURETTAGE OF UTERUS  2000  . DILITATION & CURRETTAGE/HYSTROSCOPY WITH NOVASURE ABLATION N/A 01/23/2015   Procedure: DILATATION & CURETTAGE/HYSTEROSCOPY WITH RESECTOSCOPE;  Surgeon: Olga Millers, MD;  Location: [facility];  Service: Gynecology;  Laterality: N/A;  . KNEE SURGERY Bilateral 1980's  . SHOULDER SURGERY    . WRIST SURGERY  1999   Social History   Occupational History  . Not on file  Tobacco Use  . Smoking status: Never Smoker  . Smokeless tobacco: Never Used  Substance and Sexual Activity  . Alcohol use: Yes    Alcohol/week: 0.0 standard drinks    Comment: occasional  . Drug  use: No  . Sexual activity: Not on file

## 2018-10-21 ENCOUNTER — Ambulatory Visit (INDEPENDENT_AMBULATORY_CARE_PROVIDER_SITE_OTHER): Payer: BC Managed Care – PPO | Admitting: Physical Medicine and Rehabilitation

## 2018-10-21 ENCOUNTER — Other Ambulatory Visit: Payer: BC Managed Care – PPO

## 2018-10-21 ENCOUNTER — Ambulatory Visit: Payer: Self-pay

## 2018-10-21 VITALS — BP 137/79 | HR 86

## 2018-10-21 DIAGNOSIS — M5416 Radiculopathy, lumbar region: Secondary | ICD-10-CM

## 2018-10-21 MED ORDER — BETAMETHASONE SOD PHOS & ACET 6 (3-3) MG/ML IJ SUSP
12.0000 mg | Freq: Once | INTRAMUSCULAR | Status: AC
  Administered 2018-10-21: 12 mg

## 2018-10-21 NOTE — Progress Notes (Signed)
   Numeric Pain Rating Scale and Functional Assessment Average Pain 10   In the last MONTH (on 0-10 scale) has pain interfered with the following?  1. General activity like being  able to carry out your everyday physical activities such as walking, climbing stairs, carrying groceries, or moving a chair?  Rating(8)   +Driver, -BT, -Dye Allergies.  

## 2018-10-27 ENCOUNTER — Encounter: Payer: Self-pay | Admitting: Family Medicine

## 2018-10-27 DIAGNOSIS — M542 Cervicalgia: Secondary | ICD-10-CM

## 2018-10-27 DIAGNOSIS — R200 Anesthesia of skin: Secondary | ICD-10-CM

## 2018-10-28 NOTE — Progress Notes (Signed)
Office Visit Note  Patient: Tricia Rodriguez             Date of Birth: 05/30/1970           MRN: [id number]             PCP: Laurey Morale, MD Referring: Eunice Blase, MD Visit Date: 11/11/2018 Occupation: Stylist  Subjective:  +ANA.   History of Present Illness: Tricia Rodriguez is a 48 y.o. female seen in consultation per request of Dr. Junius Roads.  According to patient her symptoms started about 6 to 7 years ago when she had an episode of pleurisy which resolved.  She states for the duration of 3 weeks she had some irritation in her mouth where the muscles in her mouth to contract and relax and that it relieved by itself.  About 2-1/2 years ago she started having right arm pain and numbness in her arm.  She states the pain was mostly localized to her elbow.  In 2018 she was seen by Dr. supple who did MRI of her right shoulder joint and found some spurs and inflammation.  She underwent debridement surgery without any relief in her symptoms of numbness.  In December 2019 she was having difficulty raising her left arm at the time she had another MRI of her left shoulder joint and underwent debridement surgery for her left shoulder.  Left shoulder joint improved.  The numbness in her right upper extremity persist.  She states about 6 weeks ago she started experiencing lower back pain which will radiate to the right side and sometimes to the left side.  She also was having right hip joint pain.  She was seen by Dr. Junius Roads who did MRI of her lumbar spine and found bulging disc at the L4-5 level.  He also referred her to Dr. Ernestina Patches who did nerve conduction velocities and told her that the right lower extremity numbness is not related to the degenerative changes in her lumbar spine.  He did right upper extremity nerve conduction velocity and diagnosed with carpal tunnel syndrome.  She states she does not have any discomfort in her wrist but she has ongoing discomfort in her right elbow.  She had  right carpal tunnel syndrome injection by Dr. Junius Roads without much relief.  She also had an epidural in the lumbar spine about a month ago without much relief.  During this process she had some lab work which showed positive ANA for that reason she was referred to me.  She states she developed a rash on her upper lip last spring for which she was seen by dermatologist and was diagnosed with dermatitis.  She has recurrent rash in that area which is not as severe.  Activities of Daily Living:  Patient reports morning stiffness for several hours.   Patient Reports nocturnal pain.  Difficulty dressing/grooming: Reports Difficulty climbing stairs: Reports Difficulty getting out of chair: Denies Difficulty using hands for taps, buttons, cutlery, and/or writing: Denies  Review of Systems  Constitutional: Positive for activity change and fatigue. Negative for night sweats, weight gain and weight loss.  HENT: Positive for mouth dryness. Negative for mouth sores, trouble swallowing, trouble swallowing and nose dryness.   Eyes: Positive for dryness. Negative for pain, redness and visual disturbance.  Respiratory: Negative for cough, shortness of breath and difficulty breathing.   Cardiovascular: Negative for chest pain, palpitations, hypertension, irregular heartbeat and swelling in legs/feet.  Gastrointestinal: Negative for abdominal pain, blood in stool, constipation and  diarrhea.  Endocrine: Negative for increased urination.  Genitourinary: Negative for difficulty urinating, painful urination and vaginal dryness.  Musculoskeletal: Positive for arthralgias, joint pain and morning stiffness. Negative for joint swelling, myalgias, muscle weakness, muscle tenderness and myalgias.  Skin: Positive for rash. Negative for color change, hair loss, skin tightness, ulcers and sensitivity to sunlight.  Allergic/Immunologic: Negative for susceptible to infections.  Neurological: Positive for dizziness, numbness and  weakness. Negative for headaches, memory loss and night sweats.  Hematological: Negative for bruising/bleeding tendency and swollen glands.  Psychiatric/Behavioral: Negative for depressed mood, confusion and sleep disturbance. The patient is nervous/anxious.     PMFS History:  Patient Active Problem List   Diagnosis Date Noted   Anxiety, generalized 09/15/2017   Menopausal symptom 06/16/2017   Menorrhagia 06/16/2017   Attention deficit hyperactivity disorder (ADHD), predominantly inattentive type 09/03/2016   Anemia, iron deficiency 09/07/2014    Past Medical History:  Diagnosis Date   Anemia    Menorrhagia    PONV (postoperative nausea and vomiting)    Uterine fibroid    Wears contact lenses     Family History  Problem Relation Age of Onset   Alcohol abuse Mother    Arthritis Mother    ALS Father    Dementia Father    Diabetes Maternal Grandmother    Alcohol abuse Maternal Grandfather    Diabetes Paternal 67    Healthy Son    Allergies Daughter    Depression Daughter    Anxiety disorder Daughter    Past Surgical History:  Procedure Laterality Date   DILATION AND CURETTAGE OF UTERUS  2000   DILITATION & CURRETTAGE/HYSTROSCOPY WITH NOVASURE ABLATION N/A 01/23/2015   Procedure: DILATATION & CURETTAGE/HYSTEROSCOPY WITH RESECTOSCOPE;  Surgeon: Olga Millers, MD;  Location: [facility];  Service: Gynecology;  Laterality: N/A;   KNEE SURGERY Bilateral 1980's   SHOULDER SURGERY Left 01/2018   SHOULDER SURGERY Right 11/2016   WRIST SURGERY  1999   Social History   Social History Narrative   Not on file   Immunization History  Administered Date(s) Administered   Influenza-Unspecified 12/25/2013, 12/05/2015   Rabies, IM 06/09/2017, 06/12/2017, 06/23/2017   Tdap 06/09/2017   Zoster Recombinat (Shingrix) 08/14/2018     Objective: Vital Signs: BP 125/80 (BP Location: Right Arm, Patient Position: Sitting, Cuff  Size: Normal)    Pulse 78    Ht 5' 4.5" (1.638 m)    Wt 102 lb (46.3 kg)    BMI 17.24 kg/m    Physical Exam Vitals signs and nursing note reviewed.  Constitutional:      Appearance: She is well-developed.  HENT:     Head: Normocephalic and atraumatic.  Eyes:     Conjunctiva/sclera: Conjunctivae normal.  Neck:     Musculoskeletal: Normal range of motion.  Cardiovascular:     Rate and Rhythm: Normal rate and regular rhythm.     Heart sounds: Normal heart sounds.  Pulmonary:     Effort: Pulmonary effort is normal.     Breath sounds: Normal breath sounds.  Abdominal:     General: Bowel sounds are normal.     Palpations: Abdomen is soft.  Lymphadenopathy:     Cervical: No cervical adenopathy.  Skin:    General: Skin is warm and dry.     Capillary Refill: Capillary refill takes less than 2 seconds.  Neurological:     Mental Status: She is alert and oriented to person, place, and time.  Psychiatric:  Behavior: Behavior normal.      Musculoskeletal Exam: C-spine, thoracic and lumbar spine were in good range of motion.  Shoulder joints, elbow joints, wrist joints, MCPs PIPs DIPs with good range of motion.  She has DIP and PIP thickening bilaterally consistent with osteoarthritis.  Hip joints, knee joints, ankles, MTPs PIPs been good range of motion with no synovitis.  CDAI Exam: CDAI Score: -- Patient Global: --; Provider Global: -- Swollen: --; Tender: -- Joint Exam   No joint exam has been documented for this visit   There is currently no information documented on the homunculus. Go to the Rheumatology activity and complete the homunculus joint exam.  Investigation: Findings:  09/29/18: ANA 1:80 NS, CCP 16, RF<14, uric acid 2.7, CRP<0.3, TPO ab-, vitamin D 31  Component     Latest Ref Rng & Units 09/29/2018  ANA Titer 1     titer 1:80 (H)  ANA Pattern 1      Nuclear, Speckled (A)  Anti Nuclear Antibody (ANA)     NEGATIVE POSITIVE (A)  Cyclic Citrullin Peptide  Ab     UNITS <16  Thyroperoxidase Ab SerPl-aCnc     <9 IU/mL <1  RA Latex Turbid.     <14 IU/mL <14  Uric Acid, Serum     2.5 - 7.0 mg/dL 2.7  Vitamin D, 25-Hydroxy     30 - 100 ng/mL 31  hs-CRP     mg/L <0.3   Imaging: Xr C-arm No Report  Result Date: 10/21/2018 Please see Notes tab for imaging impression.   Recent Labs: Lab Results  Component Value Date   WBC 4.3 08/10/2018   HGB 14.3 08/10/2018   PLT 224.0 08/10/2018   NA 139 08/10/2018   K 4.5 08/10/2018   CL 105 08/10/2018   CO2 28 08/10/2018   GLUCOSE 88 08/10/2018   BUN 10 08/10/2018   CREATININE 0.65 08/10/2018   BILITOT 0.5 08/10/2018   ALKPHOS 62 08/10/2018   AST 22 08/10/2018   ALT 18 08/10/2018   PROT 6.9 08/10/2018   ALBUMIN 4.5 08/10/2018   CALCIUM 8.6 08/10/2018   GFRAA >60 01/20/2015    Speciality Comments: No specialty comments available.  Procedures:  No procedures performed Allergies: Penicillins   Assessment / Plan:     Visit Diagnoses: Positive ANA (antinuclear antibody)-patient was referred to me for evaluation of positive ANA.  She has low titer ANA which is not specific.  She does not have any other clinical features of autoimmune disease.  She is quite concerned that she may have underlying autoimmune process.  I will obtain AVISE labs today.  She has no history of nasal ulcers, oral ulcers, Raynaud's, photosensitivity.  She gives history of sicca symptoms.  Carpal tunnel syndrome, right upper limb-she had recent nerve conduction velocity which showed moderate to severe carpal tunnel syndrome.  She had an injection by Dr. Junius Roads.  Patient states that she had no response to the injection.  Primary osteoarthritis of both hands-she has DIP and PIP thickening in her hands.  Joint protection muscle strengthening was discussed.  She has chronic discomfort in her hands.  Chronic pain of both shoulders-patient has had surgery on her both shoulders by Dr. supple.  She states she had debridement  surgery and had relief in her left shoulder but not much in her right shoulder.  Chronic pain of both knees-patient reports multiple sports injuries in the past and also meniscectomy in the past.  She has no warmth swelling or  effusion in her knee joints on my examination today.  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  She also had mild spinal stenosis.  Patient states that she had epidural injections without any relief.  She has intermittent radiculopathy sometimes to the right and sometimes to the left side.  She has tried physical therapy chiropractic care and dry needling without much relief.  She has persistent symptoms.  She is quite frustrated about the lower back pain.  Numbness and tingling of right leg  Anxiety, generalized  History of iron deficiency anemia-patient states that she had uterine embolization which was helpful.  Attention deficit hyperactivity disorder (ADHD), predominantly inattentive type  Orders: No orders of the defined types were placed in this encounter.  No orders of the defined types were placed in this encounter.   Face-to-face time spent with patient was 50 minutes. Greater than 50% of time was spent in counseling and coordination of care.  Follow-Up Instructions: Return for Positive ANA.   Bo Merino, MD  Note - This record has been created using Editor, commissioning.  Chart creation errors have been sought, but may not always  have been located. Such creation errors do not reflect on  the standard of medical care.

## 2018-10-29 NOTE — Telephone Encounter (Signed)
Please advise 

## 2018-10-30 NOTE — Telephone Encounter (Signed)
Yes they will screen for things like colitis and inflammatory bowel disease. It they see anything out of the ordinary, they will take biopsies. By the way you cannot see IBS. It does not show up on any test, it is a diagnosis based on symptoms

## 2018-11-03 HISTORY — PX: COLONOSCOPY: SHX174

## 2018-11-04 ENCOUNTER — Encounter: Payer: Self-pay | Admitting: Physical Medicine and Rehabilitation

## 2018-11-09 NOTE — Procedures (Signed)
Lumbosacral Transforaminal Epidural Steroid Injection - Sub-Pedicular Approach with Fluoroscopic Guidance  Patient: Tricia Rodriguez      Date of Birth: 03/30/1970 MRN: [id number] PCP: Laurey Morale, MD      Visit Date: 10/21/2018   Universal Protocol:    Date/Time: 10/21/2018  Consent Given By: the patient  Position: PRONE  Additional Comments: Vital signs were monitored before and after the procedure. Patient was prepped and draped in the usual sterile fashion. The correct patient, procedure, and site was verified.   Injection Procedure Details:  Procedure Site One Meds Administered:  Meds ordered this encounter  Medications  . betamethasone acetate-betamethasone sodium phosphate (CELESTONE) injection 12 mg    Laterality: Right  Location/Site:  L5-S1  Needle size: 22 G  Needle type: Spinal  Needle Placement: Transforaminal  Findings:    -Comments: Excellent flow of contrast along the nerve and into the epidural space.  Procedure Details: After squaring off the end-plates to get a true AP view, the C-arm was positioned so that an oblique view of the foramen as noted above was visualized. The target area is just inferior to the "nose of the scotty dog" or sub pedicular. The soft tissues overlying this structure were infiltrated with 2-3 ml. of 1% Lidocaine without Epinephrine.  The spinal needle was inserted toward the target using a "trajectory" view along the fluoroscope beam.  Under AP and lateral visualization, the needle was advanced so it did not puncture dura and was located close the 6 O'Clock position of the pedical in AP tracterory. Biplanar projections were used to confirm position. Aspiration was confirmed to be negative for CSF and/or blood. A 1-2 ml. volume of Isovue-250 was injected and flow of contrast was noted at each level. Radiographs were obtained for documentation purposes.   After attaining the desired flow of contrast documented above, a 0.5  to 1.0 ml test dose of 0.25% Marcaine was injected into each respective transforaminal space.  The patient was observed for 90 seconds post injection.  After no sensory deficits were reported, and normal lower extremity motor function was noted,   the above injectate was administered so that equal amounts of the injectate were placed at each foramen (level) into the transforaminal epidural space.   Additional Comments:  The patient tolerated the procedure well Dressing: 2 x 2 sterile gauze and Band-Aid    Post-procedure details: Patient was observed during the procedure. Post-procedure instructions were reviewed.  Patient left the clinic in stable condition.

## 2018-11-09 NOTE — Progress Notes (Signed)
Tricia Rodriguez - 47 y.o. female MRN [id number]  Date of birth: 1970-03-21  Office Visit Note: Visit Date: 10/21/2018 PCP: Laurey Morale, MD Referred by: Laurey Morale, MD  Subjective: Chief Complaint  Patient presents with  . Lower Back - Pain  . Right Leg - Numbness, Tingling  . Left Leg - Tingling, Numbness  . Right Foot - Tingling, Numbness  . Left Foot - Tingling, Numbness   HPI:  Tricia Rodriguez is a 48 y.o. female who comes in today At the request of Dr. Legrand Como hilts for diagnostic and hopefully therapeutic right L5 transforaminal epidural steroid injection.  Patient now getting tingling on the left side as well.  Please see prior notes of Dr. Junius Roads for further details justification.  I recently saw her for electrodiagnostic study of the right upper limb and right lower limb.  ROS Otherwise per HPI.  Assessment & Plan: Visit Diagnoses:  1. Lumbar radiculopathy     Plan: No additional findings.   Meds & Orders:  Meds ordered this encounter  Medications  . betamethasone acetate-betamethasone sodium phosphate (CELESTONE) injection 12 mg    Orders Placed This Encounter  Procedures  . XR C-ARM NO REPORT  . Epidural Steroid injection    Follow-up: Return for Eunice Blase, MD.   Procedures: No procedures performed  Lumbosacral Transforaminal Epidural Steroid Injection - Sub-Pedicular Approach with Fluoroscopic Guidance  Patient: Tricia Rodriguez      Date of Birth: 08-Mar-1970 MRN: [id number] PCP: Laurey Morale, MD      Visit Date: 10/21/2018   Universal Protocol:    Date/Time: 10/21/2018  Consent Given By: the patient  Position: PRONE  Additional Comments: Vital signs were monitored before and after the procedure. Patient was prepped and draped in the usual sterile fashion. The correct patient, procedure, and site was verified.   Injection Procedure Details:  Procedure Site One Meds Administered:  Meds ordered this encounter   Medications  . betamethasone acetate-betamethasone sodium phosphate (CELESTONE) injection 12 mg    Laterality: Right  Location/Site:  L5-S1  Needle size: 22 G  Needle type: Spinal  Needle Placement: Transforaminal  Findings:    -Comments: Excellent flow of contrast along the nerve and into the epidural space.  Procedure Details: After squaring off the end-plates to get a true AP view, the C-arm was positioned so that an oblique view of the foramen as noted above was visualized. The target area is just inferior to the "nose of the scotty dog" or sub pedicular. The soft tissues overlying this structure were infiltrated with 2-3 ml. of 1% Lidocaine without Epinephrine.  The spinal needle was inserted toward the target using a "trajectory" view along the fluoroscope beam.  Under AP and lateral visualization, the needle was advanced so it did not puncture dura and was located close the 6 O'Clock position of the pedical in AP tracterory. Biplanar projections were used to confirm position. Aspiration was confirmed to be negative for CSF and/or blood. A 1-2 ml. volume of Isovue-250 was injected and flow of contrast was noted at each level. Radiographs were obtained for documentation purposes.   After attaining the desired flow of contrast documented above, a 0.5 to 1.0 ml test dose of 0.25% Marcaine was injected into each respective transforaminal space.  The patient was observed for 90 seconds post injection.  After no sensory deficits were reported, and normal lower extremity motor function was noted,   the above injectate was administered so that equal  amounts of the injectate were placed at each foramen (level) into the transforaminal epidural space.   Additional Comments:  The patient tolerated the procedure well Dressing: 2 x 2 sterile gauze and Band-Aid    Post-procedure details: Patient was observed during the procedure. Post-procedure instructions were reviewed.  Patient left  the clinic in stable condition.    Clinical History: EXAM: MRI LUMBAR SPINE WITHOUT CONTRAST  TECHNIQUE: Multiplanar, multisequence MR imaging of the lumbar spine was performed. No intravenous contrast was administered.  COMPARISON:  X-ray 09/29/2018  FINDINGS: Segmentation:  Standard.  Alignment:  Physiologic.  Vertebrae:  No fracture, evidence of discitis, or bone lesion.  Conus medullaris and cauda equina: Conus extends to the L1 level. Conus and cauda equina appear normal.  Paraspinal and other soft tissues: Negative.  Disc levels:  T12-L1: No significant disc protrusion, foraminal stenosis, or canal stenosis.  L1-L2: No significant disc protrusion, foraminal stenosis, or canal stenosis.  L2-L3: No significant disc protrusion, foraminal stenosis, or canal stenosis.  L3-L4: No significant disc protrusion, foraminal stenosis, or canal stenosis.  L4-L5: Disc desiccation with focal posterior disc protrusion resulting in impress upon the ventral thecal sac, right greater than left subarticular recess narrowing, and mild canal stenosis. Mild bilateral facet arthrosis contribute to mild bilateral foraminal stenosis.  L5-S1: Mild bilateral facet arthrosis. No significant disc protrusion, foraminal stenosis, or canal stenosis.  IMPRESSION: Lower lumbar spondylosis most pronounced at L4-5 where a posterior disc protrusion and bilateral facet arthrosis contribute to mild bilateral foraminal stenosis, bilateral subarticular recess stenosis, and mild canal stenosis.   Electronically Signed   By: Davina Poke M.D.   On: 10/10/2018 17:14     Objective:  VS:  HT:    WT:   BMI:     BP:137/79  HR:86bpm  TEMP: ( )  RESP:  Physical Exam  Ortho Exam Imaging: No results found.

## 2018-11-11 ENCOUNTER — Encounter: Payer: Self-pay | Admitting: Rheumatology

## 2018-11-11 ENCOUNTER — Other Ambulatory Visit: Payer: Self-pay

## 2018-11-11 ENCOUNTER — Ambulatory Visit: Payer: BC Managed Care – PPO | Admitting: Rheumatology

## 2018-11-11 VITALS — BP 125/80 | HR 78 | Ht 64.5 in | Wt 102.0 lb

## 2018-11-11 DIAGNOSIS — Z862 Personal history of diseases of the blood and blood-forming organs and certain disorders involving the immune mechanism: Secondary | ICD-10-CM

## 2018-11-11 DIAGNOSIS — M19041 Primary osteoarthritis, right hand: Secondary | ICD-10-CM

## 2018-11-11 DIAGNOSIS — F411 Generalized anxiety disorder: Secondary | ICD-10-CM

## 2018-11-11 DIAGNOSIS — R200 Anesthesia of skin: Secondary | ICD-10-CM

## 2018-11-11 DIAGNOSIS — G5601 Carpal tunnel syndrome, right upper limb: Secondary | ICD-10-CM | POA: Diagnosis not present

## 2018-11-11 DIAGNOSIS — M25511 Pain in right shoulder: Secondary | ICD-10-CM

## 2018-11-11 DIAGNOSIS — M5136 Other intervertebral disc degeneration, lumbar region: Secondary | ICD-10-CM | POA: Diagnosis not present

## 2018-11-11 DIAGNOSIS — F900 Attention-deficit hyperactivity disorder, predominantly inattentive type: Secondary | ICD-10-CM

## 2018-11-11 DIAGNOSIS — M25512 Pain in left shoulder: Secondary | ICD-10-CM

## 2018-11-11 DIAGNOSIS — G8929 Other chronic pain: Secondary | ICD-10-CM

## 2018-11-11 DIAGNOSIS — M19042 Primary osteoarthritis, left hand: Secondary | ICD-10-CM

## 2018-11-11 DIAGNOSIS — R768 Other specified abnormal immunological findings in serum: Secondary | ICD-10-CM

## 2018-11-11 DIAGNOSIS — M25562 Pain in left knee: Secondary | ICD-10-CM

## 2018-11-11 DIAGNOSIS — M25561 Pain in right knee: Secondary | ICD-10-CM

## 2018-11-11 DIAGNOSIS — R202 Paresthesia of skin: Secondary | ICD-10-CM

## 2018-11-12 ENCOUNTER — Other Ambulatory Visit: Payer: Self-pay

## 2018-11-12 ENCOUNTER — Encounter: Payer: Self-pay | Admitting: Family Medicine

## 2018-11-12 ENCOUNTER — Ambulatory Visit
Admission: RE | Admit: 2018-11-12 | Discharge: 2018-11-12 | Disposition: A | Discharge status: Home / Self Care | Payer: BC Managed Care – PPO | Source: Ambulatory Visit | Attending: Family Medicine | Admitting: Family Medicine

## 2018-11-12 DIAGNOSIS — G5601 Carpal tunnel syndrome, right upper limb: Secondary | ICD-10-CM

## 2018-11-12 DIAGNOSIS — R202 Paresthesia of skin: Secondary | ICD-10-CM

## 2018-11-12 DIAGNOSIS — R200 Anesthesia of skin: Secondary | ICD-10-CM

## 2018-11-12 DIAGNOSIS — M5126 Other intervertebral disc displacement, lumbar region: Secondary | ICD-10-CM

## 2018-11-12 DIAGNOSIS — G8929 Other chronic pain: Secondary | ICD-10-CM

## 2018-11-13 ENCOUNTER — Telehealth: Payer: Self-pay | Admitting: Family Medicine

## 2018-11-13 NOTE — Addendum Note (Signed)
Addended by: Hortencia Pilar on: 11/13/2018 08:02 AM   Modules accepted: Orders

## 2018-11-13 NOTE — Telephone Encounter (Signed)
Brain MRI looks normal.

## 2018-11-17 ENCOUNTER — Ambulatory Visit: Payer: BC Managed Care – PPO | Admitting: Rheumatology

## 2018-11-18 ENCOUNTER — Ambulatory Visit: Payer: BC Managed Care – PPO | Admitting: Rheumatology

## 2018-11-24 ENCOUNTER — Other Ambulatory Visit: Payer: BC Managed Care – PPO

## 2018-11-25 NOTE — Progress Notes (Deleted)
Office Visit Note  Patient: Tricia Rodriguez             Date of Birth: March 28, 1970           MRN: [id number]             PCP: Laurey Morale, MD Referring: Laurey Morale, MD Visit Date: 12/09/2018 Occupation: @GUAROCC @  Subjective:  No chief complaint on file.   History of Present Illness: Tricia Rodriguez is a 48 y.o. female ***   Activities of Daily Living:  Patient reports morning stiffness for *** {minute/hour:19697}.   Patient {ACTIONS;DENIES/REPORTS:21021675::"Denies"} nocturnal pain.  Difficulty dressing/grooming: {ACTIONS;DENIES/REPORTS:21021675::"Denies"} Difficulty climbing stairs: {ACTIONS;DENIES/REPORTS:21021675::"Denies"} Difficulty getting out of chair: {ACTIONS;DENIES/REPORTS:21021675::"Denies"} Difficulty using hands for taps, buttons, cutlery, and/or writing: {ACTIONS;DENIES/REPORTS:21021675::"Denies"}  No Rheumatology ROS completed.   PMFS History:  Patient Active Problem List   Diagnosis Date Noted   Anxiety, generalized 09/15/2017   Menopausal symptom 06/16/2017   Menorrhagia 06/16/2017   Attention deficit hyperactivity disorder (ADHD), predominantly inattentive type 09/03/2016   Anemia, iron deficiency 09/07/2014    Past Medical History:  Diagnosis Date   Anemia    Menorrhagia    PONV (postoperative nausea and vomiting)    Uterine fibroid    Wears contact lenses     Family History  Problem Relation Age of Onset   Alcohol abuse Mother    Arthritis Mother    ALS Father    Dementia Father    Diabetes Maternal Grandmother    Alcohol abuse Maternal Grandfather    Diabetes Paternal 77    Healthy Son    Allergies Daughter    Depression Daughter    Anxiety disorder Daughter    Past Surgical History:  Procedure Laterality Date   DILATION AND CURETTAGE OF UTERUS  2000   DILITATION & CURRETTAGE/HYSTROSCOPY WITH NOVASURE ABLATION N/A 01/23/2015   Procedure: DILATATION & CURETTAGE/HYSTEROSCOPY WITH  RESECTOSCOPE;  Surgeon: Olga Millers, MD;  Location: [facility];  Service: Gynecology;  Laterality: N/A;   KNEE SURGERY Bilateral 1980's   SHOULDER SURGERY Left 01/2018   SHOULDER SURGERY Right 11/2016   WRIST SURGERY  1999   Social History   Social History Narrative   Not on file   Immunization History  Administered Date(s) Administered   Influenza-Unspecified 12/25/2013, 12/05/2015   Rabies, IM 06/09/2017, 06/12/2017, 06/23/2017   Tdap 06/09/2017   Zoster Recombinat (Shingrix) 08/14/2018     Objective: Vital Signs: There were no vitals taken for this visit.   Physical Exam   Musculoskeletal Exam: ***  CDAI Exam: CDAI Score: -- Patient Global: --; Provider Global: -- Swollen: --; Tender: -- Joint Exam   No joint exam has been documented for this visit   There is currently no information documented on the homunculus. Go to the Rheumatology activity and complete the homunculus joint exam.  Investigation: No additional findings.  Imaging: Mr Brain Wo Contrast  Result Date: 11/13/2018 CLINICAL DATA:  Carpal tunnel syndrome, right upper limb. Numbness and tingling of right leg. Right arm numbness. Facial numbness. Numbness or tingling, paresthesias. Additional history provided: Numbness in right arm and hand since July 2020, rule out MS. EXAM: MRI HEAD WITHOUT CONTRAST TECHNIQUE: Multiplanar, multiecho pulse sequences of the brain and surrounding structures were obtained without intravenous contrast. COMPARISON:  No pertinent prior studies available for comparison. FINDINGS: Brain: There is no evidence of acute infarct. No evidence of intracranial mass. No midline shift or extra-axial fluid collection. No chronic intracranial blood products. No focal parenchymal  signal abnormality is identified. Cerebral volume is normal for age. Vascular: Flow voids maintained within the proximal large arterial vessels. Skull and upper cervical spine: No focal marrow  lesion Sinuses/Orbits: Visualized orbits demonstrate no acute abnormality. Trace ethmoid sinus mucosal thickening. No significant mastoid effusion at the imaged levels. IMPRESSION: Normal MRI appearance of the brain for age. No evidence of acute intracranial abnormality. Electronically Signed   By: Kellie Simmering   On: 11/13/2018 13:33    Recent Labs: Lab Results  Component Value Date   WBC 4.3 08/10/2018   HGB 14.3 08/10/2018   PLT 224.0 08/10/2018   NA 139 08/10/2018   K 4.5 08/10/2018   CL 105 08/10/2018   CO2 28 08/10/2018   GLUCOSE 88 08/10/2018   BUN 10 08/10/2018   CREATININE 0.65 08/10/2018   BILITOT 0.5 08/10/2018   ALKPHOS 62 08/10/2018   AST 22 08/10/2018   ALT 18 08/10/2018   PROT 6.9 08/10/2018   ALBUMIN 4.5 08/10/2018   CALCIUM 8.6 08/10/2018   GFRAA >60 01/20/2015   09/29/18: ANA 1:80 NS, CCP 16, RF<14, uric acid 2.7, CRP<0.3, TPO ab-, vitamin D 31 October 7, 2020AVISE index -1.4, ANA 1: 80 NH, ENA negative, CB CAP negative, Jo 1 -, RF negative, anti-CCP negative, anticardiolipin negative, beta-2 negative, antithyroglobulin negative, TPO negative  Speciality Comments: No specialty comments available.  Procedures:  No procedures performed Allergies: Penicillins   Assessment / Plan:     Visit Diagnoses: No diagnosis found.  Orders: No orders of the defined types were placed in this encounter.  No orders of the defined types were placed in this encounter.   Face-to-face time spent with patient was *** minutes. Greater than 50% of time was spent in counseling and coordination of care.  Follow-Up Instructions: No follow-ups on file.   Bo Merino, MD  Note - This record has been created using Editor, commissioning.  Chart creation errors have been sought, but may not always  have been located. Such creation errors do not reflect on  the standard of medical care.

## 2018-11-26 ENCOUNTER — Other Ambulatory Visit: Payer: BC Managed Care – PPO

## 2018-11-30 ENCOUNTER — Telehealth: Payer: Self-pay | Admitting: Rheumatology

## 2018-11-30 NOTE — Telephone Encounter (Signed)
Patient calling to see if she needs to come in for her follow up appt. Per patient, she was to have labs, and if they were negative she did not need to come in for her appt. Please call to advise.

## 2018-12-01 NOTE — Telephone Encounter (Signed)
I reviewed AVISE labs.  Index is -1.4.  Besides ANA 1: 1 NH, all other autoimmune labs were negative.  Based on her clinical symptoms and her labs I do not think that she has underlying autoimmune disease.  She should contact us in case she develops any new symptoms.  I tried calling patient and could not reach her.  Please notify her and also forward the [place] labs to her PCP.  Thank you  Bo Merino, MD

## 2018-12-02 NOTE — Telephone Encounter (Signed)
Patient advised Dr. Estanislado Pandy reviewed [place] labs.  Index is -1.4.  Besides ANA 1: 66 NH, all other autoimmune labs were negative.  Based on her clinical symptoms and her labs I do not think that she has underlying autoimmune disease.  She should contact us in case she develops any new symptoms.  I tried calling patient and could not reach her.  Copy forwarded of the [place] labs to her PCP

## 2018-12-06 ENCOUNTER — Other Ambulatory Visit: Payer: Self-pay | Admitting: Family Medicine

## 2018-12-06 NOTE — Telephone Encounter (Signed)
Please see rex refill request.

## 2018-12-07 ENCOUNTER — Other Ambulatory Visit: Payer: Self-pay | Admitting: Family Medicine

## 2018-12-07 NOTE — Telephone Encounter (Signed)
Please See rx request

## 2018-12-08 MED ORDER — AMPHETAMINE-DEXTROAMPHET ER 20 MG PO CP24: 20.0000 mg | ORAL_CAPSULE | ORAL | 0 refills | Status: DC

## 2018-12-08 NOTE — Telephone Encounter (Signed)
Done

## 2018-12-09 ENCOUNTER — Ambulatory Visit: Payer: BC Managed Care – PPO | Admitting: Rheumatology

## 2018-12-18 ENCOUNTER — Telehealth: Payer: Self-pay | Admitting: Family Medicine

## 2018-12-18 ENCOUNTER — Ambulatory Visit
Admission: RE | Admit: 2018-12-18 | Discharge: 2018-12-18 | Disposition: A | Discharge status: Home / Self Care | Payer: BC Managed Care – PPO | Source: Ambulatory Visit | Attending: Family Medicine | Admitting: Family Medicine

## 2018-12-18 ENCOUNTER — Other Ambulatory Visit: Payer: Self-pay

## 2018-12-18 DIAGNOSIS — M542 Cervicalgia: Secondary | ICD-10-CM

## 2018-12-18 DIAGNOSIS — R200 Anesthesia of skin: Secondary | ICD-10-CM

## 2018-12-18 NOTE — Telephone Encounter (Signed)
Neck MRI shows a C5-6 disc bulge combined with arthritic bone spurs, resulting in mild to moderate narrowing of the spinal canal, and severe right-sided narrowing of the nerve opening.  This certainly could be contributing to the arm numbness and pain.  Other levels look ok.

## 2019-01-05 HISTORY — PX: ANTERIOR CERVICAL DISCECTOMY: SHX1160

## 2019-03-13 ENCOUNTER — Other Ambulatory Visit: Payer: Self-pay | Admitting: Family Medicine

## 2019-03-15 NOTE — Telephone Encounter (Signed)
Last filled 02/07/2019 Last OV 02/07/2019  Ok to fill?

## 2019-03-16 NOTE — Telephone Encounter (Signed)
Pt called to make sure her message for the Adderall went through and that she is out.  Informed pt that it did and waiting on a response.

## 2019-03-17 ENCOUNTER — Other Ambulatory Visit: Payer: Self-pay | Admitting: Family Medicine

## 2019-03-17 MED ORDER — AMPHETAMINE-DEXTROAMPHET ER 20 MG PO CP24: 20.0000 mg | ORAL_CAPSULE | ORAL | 0 refills | Status: DC

## 2019-03-17 NOTE — Telephone Encounter (Signed)
Last filled 02/07/2019 Last OV 08/10/2018  Ok to fill?

## 2019-03-17 NOTE — Telephone Encounter (Signed)
Done

## 2019-03-29 ENCOUNTER — Other Ambulatory Visit: Payer: Self-pay

## 2019-03-29 ENCOUNTER — Emergency Department (HOSPITAL_COMMUNITY): Payer: BC Managed Care – PPO

## 2019-03-29 ENCOUNTER — Encounter (HOSPITAL_COMMUNITY): Payer: Self-pay | Admitting: Emergency Medicine

## 2019-03-29 ENCOUNTER — Emergency Department (HOSPITAL_COMMUNITY)
Admission: EM | Admit: 2019-03-29 | Discharge: 2019-03-30 | Disposition: A | Discharge status: Home / Self Care | Payer: BC Managed Care – PPO | Attending: Emergency Medicine | Admitting: Emergency Medicine

## 2019-03-29 DIAGNOSIS — R195 Other fecal abnormalities: Secondary | ICD-10-CM | POA: Diagnosis not present

## 2019-03-29 DIAGNOSIS — R1031 Right lower quadrant pain: Secondary | ICD-10-CM | POA: Insufficient documentation

## 2019-03-29 DIAGNOSIS — R109 Unspecified abdominal pain: Secondary | ICD-10-CM

## 2019-03-29 LAB — URINALYSIS, ROUTINE W REFLEX MICROSCOPIC
Bilirubin Urine: NEGATIVE
Glucose, UA: NEGATIVE mg/dL
Hgb urine dipstick: NEGATIVE
Ketones, ur: 5 mg/dL — AB
Leukocytes,Ua: NEGATIVE
Nitrite: NEGATIVE
Protein, ur: NEGATIVE mg/dL
Specific Gravity, Urine: 1.014 (ref 1.005–1.030)
pH: 8 (ref 5.0–8.0)

## 2019-03-29 LAB — COMPREHENSIVE METABOLIC PANEL
ALT: 25 U/L (ref 0–44)
AST: 29 U/L (ref 15–41)
Albumin: 4.7 g/dL (ref 3.5–5.0)
Alkaline Phosphatase: 54 U/L (ref 38–126)
Anion gap: 9 (ref 5–15)
BUN: 14 mg/dL (ref 6–20)
CO2: 26 mmol/L (ref 22–32)
Calcium: 9.4 mg/dL (ref 8.9–10.3)
Chloride: 103 mmol/L (ref 98–111)
Creatinine, Ser: 0.59 mg/dL (ref 0.44–1.00)
GFR calc Af Amer: >60 mL/min (ref >60)
GFR calc non Af Amer: >60 mL/min (ref >60)
Glucose, Bld: 106 mg/dL — ABNORMAL HIGH (ref 70–99)
Potassium: 3.3 mmol/L — ABNORMAL LOW (ref 3.5–5.1)
Sodium: 138 mmol/L (ref 135–145)
Total Bilirubin: 0.7 mg/dL (ref 0.3–1.2)
Total Protein: 7.7 g/dL (ref 6.5–8.1)

## 2019-03-29 LAB — CBC
HCT: 42.6 % (ref 36.0–46.0)
Hemoglobin: 14.7 g/dL (ref 12.0–15.0)
MCH: 32 pg (ref 26.0–34.0)
MCHC: 34.5 g/dL (ref 30.0–36.0)
MCV: 92.8 fL (ref 80.0–100.0)
Platelets: 301 10*3/uL (ref 150–400)
RBC: 4.59 MIL/uL (ref 3.87–5.11)
RDW: 12.2 % (ref 11.5–15.5)
WBC: 7.1 10*3/uL (ref 4.0–10.5)
nRBC: 0 % (ref 0.0–0.2)

## 2019-03-29 LAB — I-STAT BETA HCG BLOOD, ED (MC, WL, AP ONLY): I-stat hCG, quantitative: <5 m[IU]/mL (ref <5)

## 2019-03-29 LAB — LIPASE, BLOOD: Lipase: 30 U/L (ref 11–51)

## 2019-03-29 MED ORDER — SODIUM CHLORIDE (PF) 0.9 % IJ SOLN
INTRAMUSCULAR | Status: AC
  Filled 2019-03-29: qty 50

## 2019-03-29 MED ORDER — SODIUM CHLORIDE 0.9 % IV BOLUS
1000.0000 mL | Freq: Once | INTRAVENOUS | Status: AC
  Administered 2019-03-29: 1000 mL via INTRAVENOUS

## 2019-03-29 MED ORDER — SODIUM CHLORIDE 0.9% FLUSH
3.0000 mL | Freq: Once | INTRAVENOUS | Status: AC
  Administered 2019-03-29: 3 mL via INTRAVENOUS

## 2019-03-29 MED ORDER — FENTANYL CITRATE (PF) 100 MCG/2ML IJ SOLN
50.0000 ug | INTRAMUSCULAR | Status: DC | PRN
  Administered 2019-03-29: 50 ug via INTRAVENOUS
  Filled 2019-03-29: qty 2

## 2019-03-29 MED ORDER — FENTANYL CITRATE (PF) 100 MCG/2ML IJ SOLN: 50.0000 ug | Freq: Once | INTRAMUSCULAR | Status: DC

## 2019-03-29 MED ORDER — IOHEXOL 300 MG/ML  SOLN
100.0000 mL | Freq: Once | INTRAMUSCULAR | Status: AC | PRN
  Administered 2019-03-29: 100 mL via INTRAVENOUS

## 2019-03-29 MED ORDER — HYDROMORPHONE HCL 1 MG/ML IJ SOLN
INTRAMUSCULAR | Status: AC
  Administered 2019-03-29: 0.5 mg via INTRAVENOUS
  Filled 2019-03-29: qty 1

## 2019-03-29 MED ORDER — ONDANSETRON HCL 4 MG/2ML IJ SOLN
4.0000 mg | Freq: Once | INTRAMUSCULAR | Status: AC
  Administered 2019-03-29: 4 mg via INTRAVENOUS
  Filled 2019-03-29: qty 2

## 2019-03-29 MED ORDER — HYDROMORPHONE HCL 1 MG/ML IJ SOLN: 0.5000 mg | Freq: Once | INTRAMUSCULAR | Status: AC

## 2019-03-29 NOTE — ED Provider Notes (Signed)
[facility] DEPT Provider Note   CSN: [id number] Arrival date & time: 03/29/19  2031     History Chief Complaint  Patient presents with  . Abdominal Pain  . Back Pain  . Flank Pain    Tricia Rodriguez is a 49 y.o. female  Who presents emergency department chief complaint of abdominal pain.  Patient states that she began having abdominal pain yesterday evening.  She complains of pain in the right lower quadrant of her abdomen radiating toward her flank which resolved after laying down last night.  She had some intermittent nagging mild pain throughout the day however around 5:30 PM she began walking her dog and some increase in her pain.  During dinner she took 2 bites of food and then had sudden onset severe pain in the right lower quadrant of her abdomen radiating toward her back.  She states that it feels like "when I was in labor."  She denies any urinary symptoms.  The pain is constant and at times severely worse.  She rates the pain as severe.  She states that she cannot find a comfortable position.  She denies nausea, vomiting, hematuria, frequency, urgency, history of kidney stones.  She has no previous abdominal surgeries.  She denies constipation or diarrhea. The history is provided by the patient and medical records. No language interpreter was used.       Past Medical History:  Diagnosis Date  . Anemia   . Menorrhagia   . PONV (postoperative nausea and vomiting)   . Uterine fibroid   . Wears contact lenses     Patient Active Problem List   Diagnosis Date Noted  . Anxiety, generalized 09/15/2017  . Menopausal symptom 06/16/2017  . Menorrhagia 06/16/2017  . 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 Anemia, iron deficiency 09/07/2014    Past Surgical History:  Procedure Laterality Date  . DILATION AND CURETTAGE OF UTERUS  2000  . DILITATION & CURRETTAGE/HYSTROSCOPY WITH NOVASURE  ABLATION N/A 01/23/2015   Procedure: DILATATION & CURETTAGE/HYSTEROSCOPY WITH RESECTOSCOPE;  Surgeon: Olga Millers, MD;  Location: [facility];  Service: Gynecology;  Laterality: N/A;  . KNEE SURGERY Bilateral 1980's  . SHOULDER SURGERY Left 01/2018  . SHOULDER SURGERY Right 11/2016  . WRIST SURGERY  1999     OB History   No obstetric history on file.     Family History  Problem Relation Age of Onset  . Alcohol abuse Mother   . Arthritis Mother   . ALS Father   . Dementia Father   . Diabetes Maternal Grandmother   . Alcohol abuse Maternal Grandfather   . Diabetes Paternal Grandmother   . Healthy Son   . Allergies Daughter   . Depression Daughter   . Anxiety disorder Daughter     Social History   Tobacco Use  . Smoking status: Never Smoker  . Smokeless tobacco: Never Used  Substance Use Topics  . Alcohol use: Yes    Alcohol/week: 0.0 standard drinks    Comment: occasional  . Drug use: No    Home Medications Prior to Admission medications   Medication Sig Start Date End Date Taking? Authorizing Provider  ALPRAZolam Duanne Moron) 0.5 MG tablet Take 1 tablet (0.5 mg total) by mouth 2 (two) times daily as needed for anxiety. Patient not taking: Reported on 03/29/2019 08/10/18   Laurey Morale, MD  amphetamine-dextroamphetamine (ADDERALL XR) 20 MG 24 hr capsule Take 1 capsule (20 mg  total) by mouth every morning. 05/15/19 06/14/19  Laurey Morale, MD  clindamycin (CLEOCIN) 150 MG capsule Take 150 mg by mouth 3 (three) times daily. 03/23/19   [provider]  cyclobenzaprine (FLEXERIL) 10 MG tablet Take 10 mg by mouth 3 (three) times daily as needed for muscle spasms.    [provider]  diazepam (VALIUM) 5 MG tablet Take 1 by mouth 1 hour  pre-procedure with very light food. May bring 2nd tablet to appointment. 10/15/18   Magnus Sinning, MD  gabapentin (NEURONTIN) 100 MG capsule 1 PO q HS, may increase to 3 PO q HS if needed. Patient not taking:  Reported on 03/29/2019 09/29/18   Hilts, Legrand Como, MD  nabumetone (RELAFEN) 750 MG tablet Take 1 tablet (750 mg total) by mouth 2 (two) times daily as needed. 09/29/18   Hilts, Michael, MD    Allergies    Penicillins  Review of Systems   Review of Systems Ten systems reviewed and are negative for acute change, except as noted in the HPI.   Physical Exam Updated Vital Signs BP 108/90   Pulse 75   Temp 97.6 F (36.4 C) (Oral)   Resp (!) 22   Ht 5' 4.5" (1.638 m)   Wt 48.5 kg   SpO2 100%   BMI 18.08 kg/m   Physical Exam Vitals and nursing note reviewed.  Constitutional:      General: She is not in acute distress.    Appearance: She is well-developed. She is not diaphoretic.  HENT:     Head: Normocephalic and atraumatic.  Eyes:     General: No scleral icterus.    Conjunctiva/sclera: Conjunctivae normal.  Cardiovascular:     Rate and Rhythm: Normal rate and regular rhythm.     Heart sounds: Normal heart sounds. No murmur. No friction rub. No gallop.   Pulmonary:     Effort: Pulmonary effort is normal. No respiratory distress.     Breath sounds: Normal breath sounds.  Abdominal:     General: Abdomen is flat. Bowel sounds are normal. There is no distension.     Palpations: Abdomen is soft. There is no mass.     Tenderness: There is abdominal tenderness in the right upper quadrant, right lower quadrant and suprapubic area. There is guarding. There is no right CVA tenderness, left CVA tenderness or rebound.     Hernia: No hernia is present.    Musculoskeletal:     Cervical back: Normal range of motion.  Skin:    General: Skin is warm and dry.  Neurological:     Mental Status: She is alert and oriented to person, place, and time.  Psychiatric:        Behavior: Behavior normal.     ED Results / Procedures / Treatments   Labs (all labs ordered are listed, but only abnormal results are displayed) Labs Reviewed  COMPREHENSIVE METABOLIC PANEL - Abnormal; Notable for the  following components:      Result Value   Potassium 3.3 (*)    Glucose, Bld 106 (*)    All other components within normal limits  LIPASE, BLOOD  CBC  URINALYSIS, ROUTINE W REFLEX MICROSCOPIC  I-STAT BETA HCG BLOOD, ED ([facility], [facility], [facility] ONLY)    EKG None  Radiology No results found.  Procedures Procedures (including critical care time)  Medications Ordered in ED Medications  fentaNYL (SUBLIMAZE) injection 50 mcg (50 mcg Intravenous Given 03/29/19 2100)  sodium chloride 0.9 % bolus 1,000 mL (has no  administration in time range)  fentaNYL (SUBLIMAZE) injection 50 mcg (has no administration in time range)  ondansetron (ZOFRAN) injection 4 mg (has no administration in time range)  HYDROmorphone (DILAUDID) injection 0.5 mg (has no administration in time range)  sodium chloride flush (NS) 0.9 % injection 3 mL (3 mLs Intravenous Given 03/29/19 2100)    ED Course  I have reviewed the triage vital signs and the nursing notes.  Pertinent labs & imaging results that were available during my care of the patient were reviewed by me and considered in my medical decision making (see chart for details).    MDM Rules/Calculators/A&P                      10:57 PM BP 108/90   Pulse 75   Temp 97.6 F (36.4 C) (Oral)   Resp (!) 22   Ht 5' 4.5" (1.638 m)   Wt 48.5 kg   SpO2 100%   BMI 18.08 kg/m  Patient with severe abd pain and flank pain. The differential diagnosis of emergent flank pain includes, but is not limited to :Abdominal aortic aneurysm,, Renal artery embolism,Renal vein thrombosis, Aortic dissection, Mesenteric ischemia, Pyelonephritis, Renal infarction, Renal hemorrhage, Nephrolithiasis/ Renal Colic, Bladder tumor,Cystitis, Biliary colic, Pancreatitis Perforated peptic ulcer Appendicitis ,Inguinal Hernia, Diverticulitis, Bowel obstruction ,PID/TOA,Ovarian cyst, Ovarian torsion  Shingles Lower lobe pneumonia, Retroperitoneal hematoma/abscess/tumor, Epidural abscess, Epidural  hematoma Patient is exquisitely tender to palpation in the abdomen with guarding therefore I have ordered a CT abdomen and pelvis with contrast.  Pain medicine given.   Patient urinalysis reviewed shows no evidence of infection.  There are some ketones.  Patient is lipase within normal limits, CBC also within normal limits, negative pregnancy test, CMP shows no significant abnormalities except for some mild hypokalemia of insignificant value. Personally reviewed the CT abdomen pelvis which shows a very large stool burden on my interpretation.  Dr. Quintella Reichert reads possible mesenteric twisting in the area that the patient has complaint of pain.  Unfortunately he was gone for the day however I did discuss the case with Dr. Marguerita Merles, question intermittent cecal bascule however this is difficult to go tell given the patient's lack of omental fat and voluminous stool burden.  Patient will get a pelvic ultrasound to rule out torsion.   I personally viewed the patient's pelvic ultrasound which shows no evidence of ovarian torsion.  Patient's pain is well controlled at this time.  I discussed the findings of the CT with the patient and potential for cecal bascule which is likely worsened due to the weight of the colon given the very large stool volume.  I think that at this point the best option would be to go home and use a bowel prep with magnesium Site-Rite to get this stool moving.  If patient has return of severe pain symptoms she should return to the emergency department however be best done after large volume bowel movement.  Patient is in agreement with plan.  Pain is well controlled at this time.  Patient be discharged with magnesium citrate, Bentyl and Zofran.  I discussed return precautions.  She appears otherwise appropriate for discharge at this time     Final Clinical Impression(s) / ED Diagnoses Final diagnoses:  None    Rx / DC Orders ED Discharge Orders    None       Margarita Mail,  PA-C 03/30/19 0615    Ward, Delice Bison, DO 03/30/19 0730

## 2019-03-29 NOTE — ED Triage Notes (Signed)
Patient complains of severe abdominal pain on the right that radiates to the back. She reports she is unable to find a comfortable position. Pain started suddenly. The abdomin is painful to touch and she report pain with breathing.

## 2019-03-30 ENCOUNTER — Emergency Department (HOSPITAL_COMMUNITY): Payer: BC Managed Care – PPO

## 2019-03-30 MED ORDER — MAGNESIUM CITRATE PO SOLN: 1.0000 | Freq: Once | ORAL | Status: DC

## 2019-03-30 MED ORDER — ONDANSETRON HCL 4 MG PO TABS: 4.0000 mg | ORAL_TABLET | Freq: Three times a day (TID) | ORAL | 0 refills | Status: DC | PRN

## 2019-03-30 MED ORDER — DICYCLOMINE HCL 20 MG PO TABS: 20.0000 mg | ORAL_TABLET | Freq: Two times a day (BID) | ORAL | 0 refills | Status: DC

## 2019-03-30 NOTE — Discharge Instructions (Addendum)
You have been given a dose of magnesium citrate. If you do not produce a stool within 10 hours, you may repeat a full bottle. Normally 1 bottle will produce large volume liquid stool. It is important to remain on a healthy bowel regimen including high fiber, lots of water. You may take stool softener such as colace or take miralax twice a day with 10-12 oz of fluid. You may experience some bowel cramping and I have ordered antispasmodic medication for abdominal pain.  If you have worsening or severe recurrence of your abdominal pain peds please return to the emergency department for further evaluation.  It will be helpful if you will have made voluminous bowel movement prior to reevaluation as the stool burden was limiting the CT scan of the abdomen. Abdominal (belly) pain can be caused by many things. Your caregiver performed an examination and possibly ordered blood/urine tests and imaging (CT scan, x-rays, ultrasound). Many cases can be observed and treated at home after initial evaluation in the emergency department. Even though you are being discharged home, abdominal pain can be unpredictable. Therefore, you need a repeated exam if your pain does not resolve, returns, or worsens. Most patients with abdominal pain don't have to be admitted to the hospital or have surgery, but serious problems like appendicitis and gallbladder attacks can start out as nonspecific pain. Many abdominal conditions cannot be diagnosed in one visit, so follow-up evaluations are very important. SEEK IMMEDIATE MEDICAL ATTENTION IF: The pain does not go away or becomes severe.  A temperature above 101 develops.  Repeated vomiting occurs (multiple episodes).  The pain becomes localized to portions of the abdomen. The right side could possibly be appendicitis. In an adult, the left lower portion of the abdomen could be colitis or diverticulitis.  Blood is being passed in stools or vomit (bright red or black tarry stools).    Return also if you develop chest pain, difficulty breathing, dizziness or fainting, or become confused, poorly responsive, or inconsolable (young children).
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
## 2019-05-17 ENCOUNTER — Other Ambulatory Visit: Payer: Self-pay | Admitting: Family Medicine

## 2019-05-17 NOTE — Telephone Encounter (Signed)
Last filled 08/10/2018 Last OV 08/10/2018  Ok to fill?

## 2019-05-18 ENCOUNTER — Encounter: Payer: Self-pay | Admitting: Family Medicine

## 2019-05-18 DIAGNOSIS — M2550 Pain in unspecified joint: Secondary | ICD-10-CM

## 2019-05-18 DIAGNOSIS — R768 Other specified abnormal immunological findings in serum: Secondary | ICD-10-CM

## 2019-05-21 ENCOUNTER — Encounter: Payer: Self-pay | Admitting: Radiology

## 2019-05-25 ENCOUNTER — Other Ambulatory Visit: Payer: Self-pay | Admitting: Family Medicine

## 2019-05-25 ENCOUNTER — Encounter: Payer: Self-pay | Admitting: Family Medicine

## 2019-05-25 DIAGNOSIS — R768 Other specified abnormal immunological findings in serum: Secondary | ICD-10-CM

## 2019-05-25 DIAGNOSIS — M2550 Pain in unspecified joint: Secondary | ICD-10-CM

## 2019-06-10 ENCOUNTER — Other Ambulatory Visit: Payer: Self-pay | Admitting: Family Medicine

## 2019-06-11 MED ORDER — AMPHETAMINE-DEXTROAMPHET ER 20 MG PO CP24: 20.0000 mg | ORAL_CAPSULE | ORAL | 0 refills | Status: DC

## 2019-06-11 NOTE — Telephone Encounter (Signed)
Done

## 2019-06-11 NOTE — Telephone Encounter (Signed)
Last filled 05/15/2019 Last OV 08/10/2018 Ok to fill?

## 2019-06-17 ENCOUNTER — Other Ambulatory Visit: Payer: Self-pay | Admitting: Obstetrics and Gynecology

## 2019-06-17 DIAGNOSIS — Z1231 Encounter for screening mammogram for malignant neoplasm of breast: Secondary | ICD-10-CM

## 2019-07-19 ENCOUNTER — Ambulatory Visit
Admission: RE | Admit: 2019-07-19 | Discharge: 2019-07-19 | Disposition: A | Discharge status: Home / Self Care | Payer: BC Managed Care – PPO | Source: Ambulatory Visit | Attending: Obstetrics and Gynecology | Admitting: Obstetrics and Gynecology

## 2019-07-19 ENCOUNTER — Other Ambulatory Visit: Payer: Self-pay

## 2019-07-19 DIAGNOSIS — Z1231 Encounter for screening mammogram for malignant neoplasm of breast: Secondary | ICD-10-CM

## 2019-08-05 ENCOUNTER — Encounter: Payer: Self-pay | Admitting: Family Medicine

## 2019-08-19 ENCOUNTER — Encounter: Payer: Self-pay | Admitting: Physician Assistant

## 2019-08-19 ENCOUNTER — Other Ambulatory Visit: Payer: Self-pay

## 2019-08-19 ENCOUNTER — Ambulatory Visit (INDEPENDENT_AMBULATORY_CARE_PROVIDER_SITE_OTHER): Payer: BC Managed Care – PPO | Admitting: Physician Assistant

## 2019-08-19 DIAGNOSIS — Z1283 Encounter for screening for malignant neoplasm of skin: Secondary | ICD-10-CM

## 2019-08-19 DIAGNOSIS — L570 Actinic keratosis: Secondary | ICD-10-CM

## 2019-08-19 MED ORDER — TRETINOIN 0.1 % EX CREA: TOPICAL_CREAM | Freq: Every evening | CUTANEOUS | 6 refills | Status: AC

## 2019-08-19 NOTE — Progress Notes (Signed)
   Follow-Up Visit   Subjective  Tricia Rodriguez is a 49 y.o. female who presents for the following: Annual Exam (right neck , right thigh new to touch).   The following portions of the chart were reviewed this encounter and updated as appropriate: Tobacco  Allergies  Meds  Problems  Med Hx  Surg Hx  Fam Hx      Objective  Well appearing patient in no apparent distress; mood and affect are within normal limits.  A full examination was performed including scalp, head, eyes, ears, nose, lips, neck, chest, axillae, abdomen, back, buttocks, bilateral upper extremities, bilateral lower extremities, hands, feet, fingers, toes, fingernails, and toenails. All findings within normal limits unless otherwise noted below.  Objective  Head - Anterior (Face), Left Breast (2), Neck - Posterior, Right Breast, Right Thigh - Anterior: Erythematous patches with gritty scale.  Objective  Head - Anterior (Face): No atypical nevi No signs of non-mole skin cancer.   Assessment & Plan  AK (actinic keratosis) (6) Head - Anterior (Face); Neck - Posterior; Right Thigh - Anterior; Left Breast (2); Right Breast  Destruction of lesion - Head - Anterior (Face), Left Breast (2), Neck - Posterior, Right Breast, Right Thigh - Anterior Complexity: simple   Destruction method: cryotherapy   Informed consent: discussed and consent obtained   Timeout:  patient name, date of birth, surgical site, and procedure verified Lesion destroyed using liquid nitrogen: Yes   Cryotherapy cycles:  1 Outcome: patient tolerated procedure well with no complications   Post-procedure details: wound care instructions given    Screening exam for skin cancer Head - Anterior (Face)  Yearly skin exam   I, Alaiyah Bollman, PA-C, have reviewed all documentation's for this visit.  The documentation on 08/19/19 for the exam, diagnosis, procedures and orders are all accurate and complete.

## 2019-08-20 ENCOUNTER — Telehealth: Payer: Self-pay | Admitting: *Deleted

## 2019-08-20 NOTE — Telephone Encounter (Signed)
Received prior authorization for Rx Tretinoin 0.1% Cream: done via cover my meds ( patient was informed at office visit that this will probably be cash pay & to use goodrx coupon because patient is using for cosmetic)  Irene Limbo (Key: [id number])  Your information has been submitted to [place]. To check for an updated outcome later, reopen this PA request from your dashboard.  If [employer] has not responded to your request within 24 hours, contact [facility] at [phone]. If you think there may be a problem with your PA request, use our live chat feature at the bottom right.

## 2019-08-25 ENCOUNTER — Other Ambulatory Visit: Payer: Self-pay

## 2019-08-25 ENCOUNTER — Encounter: Payer: Self-pay | Admitting: Family Medicine

## 2019-08-25 ENCOUNTER — Ambulatory Visit: Payer: BC Managed Care – PPO | Admitting: Family Medicine

## 2019-08-25 VITALS — BP 110/60 | HR 93 | Temp 98.4°F | Wt 107.8 lb

## 2019-08-25 DIAGNOSIS — F900 Attention-deficit hyperactivity disorder, predominantly inattentive type: Secondary | ICD-10-CM | POA: Diagnosis not present

## 2019-08-25 MED ORDER — AMPHETAMINE-DEXTROAMPHET ER 20 MG PO CP24: 20.0000 mg | ORAL_CAPSULE | ORAL | 0 refills | Status: DC

## 2019-08-25 NOTE — Progress Notes (Signed)
° °  Subjective:    Patient ID: Tricia Rodriguez, female    DOB: Jul 04, 1970, 49 y.o.   MRN: [id number]  HPI Here to follow up. Her ADHD has been doing well, and she is pleased with the medication. Her BP and weight are stable.    Review of Systems  Constitutional: Negative.   Respiratory: Negative.   Cardiovascular: Negative.   Neurological: Negative.   Psychiatric/Behavioral: Negative.        Objective:   Physical Exam Constitutional:      Appearance: Normal appearance.  Cardiovascular:     Rate and Rhythm: Normal rate and regular rhythm.     Pulses: Normal pulses.     Heart sounds: Normal heart sounds.  Pulmonary:     Effort: Pulmonary effort is normal.     Breath sounds: Normal breath sounds.  Neurological:     General: No focal deficit present.     Mental Status: She is alert and oriented to person, place, and time.           Assessment & Plan:  ADHD, stable. Refilled the Adderall XR. Alysia Penna, MD

## 2019-08-31 ENCOUNTER — Ambulatory Visit (INDEPENDENT_AMBULATORY_CARE_PROVIDER_SITE_OTHER): Payer: BC Managed Care – PPO | Admitting: Family Medicine

## 2019-08-31 ENCOUNTER — Encounter: Payer: Self-pay | Admitting: Family Medicine

## 2019-08-31 VITALS — BP 129/77 | HR 67 | Ht 64.5 in | Wt 108.6 lb

## 2019-08-31 DIAGNOSIS — R202 Paresthesia of skin: Secondary | ICD-10-CM

## 2019-08-31 DIAGNOSIS — Z0000 Encounter for general adult medical examination without abnormal findings: Secondary | ICD-10-CM | POA: Diagnosis not present

## 2019-08-31 DIAGNOSIS — R768 Other specified abnormal immunological findings in serum: Secondary | ICD-10-CM | POA: Diagnosis not present

## 2019-08-31 DIAGNOSIS — E559 Vitamin D deficiency, unspecified: Secondary | ICD-10-CM

## 2019-08-31 DIAGNOSIS — R200 Anesthesia of skin: Secondary | ICD-10-CM

## 2019-08-31 NOTE — Progress Notes (Signed)
Office Visit Note   Patient: Tricia Rodriguez           Date of Birth: [birth year]           MRN: [id number] Visit Date: 08/31/2019 Requested by: Laurey Morale, MD [place],  [place] [address] PCP: Laurey Morale, MD  Subjective: Chief Complaint  Patient presents with   Annual Exam    HPI: She is here for annual wellness exam.  Overall she is feeling great from a medical standpoint.  She had cervical disc replacement this past year and is doing well from that.  She is still having low back pain with numbness and tingling into the right leg.  Physical therapy and a couple different injections per Dr. Ellene Route have not given her the relief she hoped they would.  She is contemplating talking to him about surgical options for that.  Energy level is good.  Appetite is normal.  She had an episode of severe abdominal pain after taking clindamycin for a root canal last winter, but all of the testing at the hospital turned out unremarkable.  She has recovered and has not had any troubles since then.  She is up-to-date on eye exams, dental exams, gynecology exams.  She had a colonoscopy last year which was normal.  She sees a dermatologist on a regular basis.  Family history was reviewed in detail.              ROS:   All other systems were reviewed and are negative.  Objective: Vital Signs: BP (!) 129/77    Pulse 67    Ht 5' 4.5" (1.638 m)    Wt 108 lb 9.6 oz (49.3 kg)    BMI 18.35 kg/m   Physical Exam:  General:  Alert and oriented, in no acute distress. Pulm:  Breathing unlabored. Psy:  Normal mood, congruent affect. Skin: No suspicious lesions seen. HEENT:  [place]/AT, PERRLA, EOM Full, no nystagmus.  Funduscopic examination within normal limits.  No conjunctival erythema.  Tympanic membranes are pearly gray with normal landmarks.  External ear canals are normal.  Nasal passages are clear.  Oropharynx is clear.  No significant lymphadenopathy.  No thyromegaly or  nodules.  2+ carotid pulses without bruits. CV: Regular rate and rhythm without murmurs, rubs, or gallops.  No peripheral edema.  2+ radial and posterior tibial pulses. Lungs: Clear to auscultation throughout with no wheezing or areas of consolidation. Abd: Bowel sounds are active, no hepatosplenomegaly or masses.  Soft and nontender.  No audible bruits.  No evidence of ascites. Extremities: 2+ upper and lower DTRs.  No nail deformities seen.    Imaging: No results found.  Assessment & Plan: 1.  Wellness examination -Labs to evaluate.  She is exercising regularly and eating healthfully.  No other recommendations made for now.  2.  Positive ANA -Recheck titers today.  3.  History of vitamin D deficiency -Recheck levels.  4.  Numbness and tingling, etiology uncertain. -She will keep working with Dr. Ellene Route regarding her back problems.  If that fails to alleviate her symptoms, then possibly neurology consult.     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   Anemia, iron deficiency 09/07/2014   Past Medical  History:  Diagnosis Date   Anemia    Menorrhagia    PONV (postoperative nausea and vomiting)    Uterine fibroid    Wears contact lenses     Family History  Problem Relation Age of Onset   Alcohol abuse Mother    Arthritis Mother    ALS Father    Dementia Father    Parkinson's disease Father    Diabetes Maternal Grandmother    Heart disease Maternal Grandmother    Alcohol abuse Maternal Grandfather    Diabetes Paternal 16    Healthy Son    Allergies Daughter    Depression Daughter    Anxiety disorder Daughter    Autoimmune disease Daughter        Positive ANA     Cancer Neg Hx     Past Surgical History:  Procedure Laterality Date   ANTERIOR CERVICAL DISCECTOMY  01/2019   per Dr. Ellene Route , C5-C6    [place] N/A 01/23/2015   Procedure: DILATATION & CURETTAGE/HYSTEROSCOPY WITH RESECTOSCOPE;  Surgeon: Olga Millers, MD;  Location: [facility];  Service: Gynecology;  Laterality: N/A;   KNEE SURGERY Bilateral 1980's   SHOULDER SURGERY Left 01/2018   SHOULDER SURGERY Right 11/2016   SPINE SURGERY     WRIST SURGERY  1999   Social History   Occupational History   Not on file  Tobacco Use   Smoking status: Never Smoker   Smokeless tobacco: Never Used  Vaping Use   Vaping Use: Never used  Substance and Sexual Activity   Alcohol use: Yes    Alcohol/week: 0.0 standard drinks    Comment: occasional   Drug use: No   Sexual activity: Not on file

## 2019-09-04 LAB — COMPREHENSIVE METABOLIC PANEL
AG Ratio: 1.9 (calc) (ref 1.0–2.5)
ALT: 17 U/L (ref 6–29)
AST: 20 U/L (ref 10–35)
Albumin: 4.8 g/dL (ref 3.6–5.1)
Alkaline phosphatase (APISO): 60 U/L (ref 37–153)
BUN: 14 mg/dL (ref 7–25)
CO2: 26 mmol/L (ref 20–32)
Calcium: 9.3 mg/dL (ref 8.6–10.4)
Chloride: 105 mmol/L (ref 98–110)
Creat: 0.69 mg/dL (ref 0.50–1.05)
Globulin: 2.5 g/dL (calc) (ref 1.9–3.7)
Glucose, Bld: 89 mg/dL (ref 65–99)
Potassium: 4.4 mmol/L (ref 3.5–5.3)
Sodium: 138 mmol/L (ref 135–146)
Total Bilirubin: 0.7 mg/dL (ref 0.2–1.2)
Total Protein: 7.3 g/dL (ref 6.1–8.1)

## 2019-09-04 LAB — CBC WITH DIFFERENTIAL/PLATELET
Absolute Monocytes: 422 cells/uL (ref 200–950)
Basophils Absolute: 38 cells/uL (ref 0–200)
Basophils Relative: 0.6 %
Eosinophils Absolute: 63 cells/uL (ref 15–500)
Eosinophils Relative: 1 %
HCT: 42.1 % (ref 35.0–45.0)
Hemoglobin: 14.5 g/dL (ref 11.7–15.5)
Lymphs Abs: 1449 cells/uL (ref 850–3900)
MCH: 31.9 pg (ref 27.0–33.0)
MCHC: 34.4 g/dL (ref 32.0–36.0)
MCV: 92.7 fL (ref 80.0–100.0)
MPV: 10.7 fL (ref 7.5–12.5)
Monocytes Relative: 6.7 %
Neutro Abs: 4328 cells/uL (ref 1500–7800)
Neutrophils Relative %: 68.7 %
Platelets: 245 10*3/uL (ref 140–400)
RBC: 4.54 10*6/uL (ref 3.80–5.10)
RDW: 12 % (ref 11.0–15.0)
Total Lymphocyte: 23 %
WBC: 6.3 10*3/uL (ref 3.8–10.8)

## 2019-09-04 LAB — LIPID PANEL
Cholesterol: 208 mg/dL — ABNORMAL HIGH (ref <200)
HDL: 89 mg/dL (ref >=50)
LDL Cholesterol (Calc): 103 mg/dL (calc) — ABNORMAL HIGH
Non-HDL Cholesterol (Calc): 119 mg/dL (calc) (ref <130)
Total CHOL/HDL Ratio: 2.3 (calc) (ref <5.0)
Triglycerides: 72 mg/dL (ref <150)

## 2019-09-04 LAB — ANTI-NUCLEAR AB-TITER (ANA TITER)
ANA TITER: 1:80 {titer} — ABNORMAL HIGH
ANA Titer 1: 1:40 {titer} — ABNORMAL HIGH

## 2019-09-04 LAB — HEMOGLOBIN A1C
Hgb A1c MFr Bld: 5 % of total Hgb (ref <5.7)
Mean Plasma Glucose: 97 (calc)
eAG (mmol/L): 5.4 (calc)

## 2019-09-04 LAB — VITAMIN D 25 HYDROXY (VIT D DEFICIENCY, FRACTURES): Vit D, 25-Hydroxy: 56 ng/mL (ref 30–100)

## 2019-09-04 LAB — HIGH SENSITIVITY CRP: hs-CRP: <0.3 mg/L

## 2019-09-04 LAB — THYROID PANEL WITH TSH
Free Thyroxine Index: 2.5 (ref 1.4–3.8)
T3 Uptake: 31 % (ref 22–35)
T4, Total: 8.1 ug/dL (ref 5.1–11.9)
TSH: 1.11 mIU/L

## 2019-09-04 LAB — ANA: Anti Nuclear Antibody (ANA): POSITIVE — AB

## 2019-09-06 ENCOUNTER — Telehealth: Payer: Self-pay | Admitting: Family Medicine

## 2019-09-06 NOTE — Telephone Encounter (Signed)
Labs show:  ANA numbers look stable.  CBC and CMET are normal.  CRP is excellent.  Lipid panel HDL and triglycerides are excellent.  A1C and glucose are normal.  Thyroid panel and vitamin D are normal.

## 2019-09-20 ENCOUNTER — Encounter: Payer: Self-pay | Admitting: Family Medicine

## 2019-09-23 NOTE — Telephone Encounter (Signed)
Key: [id number] - PA Case ID: [id number]

## 2019-10-06 HISTORY — PX: LUMBAR DISC SURGERY: SHX700

## 2019-11-23 ENCOUNTER — Other Ambulatory Visit: Payer: Self-pay | Admitting: Family Medicine

## 2019-11-24 MED ORDER — AMPHETAMINE-DEXTROAMPHET ER 20 MG PO CP24: 20.0000 mg | ORAL_CAPSULE | ORAL | 0 refills | Status: DC

## 2019-11-24 NOTE — Telephone Encounter (Signed)
Normal

## 2019-12-31 ENCOUNTER — Encounter: Payer: Self-pay | Admitting: Family Medicine

## 2020-01-11 ENCOUNTER — Encounter: Payer: Self-pay | Admitting: Family Medicine

## 2020-01-12 NOTE — Telephone Encounter (Signed)
Pt is calling in stating that she will be leaving to go out of the country on next week and will not be returning until 02/03/2020 and would like to see if Dr. Sarajane Jews would do a early release of Rx amphetamine-dextroamphetamine (ADDERALL XR) 20 MG pt stated that the insurance company has approved it to be released before 01/24/2020. Pharm:  [facility] [place], [place]

## 2020-01-14 NOTE — Telephone Encounter (Signed)
Spoke to pharmacy and gave the okay to fill medication early.  Then called patient and informed her VIA phone.  Dm/cma

## 2020-01-14 NOTE — Telephone Encounter (Signed)
Please tell the pharmacy that an early refill is okay

## 2020-02-26 ENCOUNTER — Other Ambulatory Visit: Payer: Self-pay | Admitting: Family Medicine

## 2020-02-26 ENCOUNTER — Encounter: Payer: Self-pay | Admitting: Family Medicine

## 2020-02-28 ENCOUNTER — Other Ambulatory Visit: Payer: Self-pay

## 2020-02-28 ENCOUNTER — Encounter: Payer: Self-pay | Admitting: Family Medicine

## 2020-02-28 ENCOUNTER — Ambulatory Visit (INDEPENDENT_AMBULATORY_CARE_PROVIDER_SITE_OTHER): Payer: BC Managed Care – PPO | Admitting: Family Medicine

## 2020-02-28 VITALS — BP 119/78 | HR 90 | Temp 98.2°F

## 2020-02-28 DIAGNOSIS — R059 Cough, unspecified: Secondary | ICD-10-CM

## 2020-02-28 DIAGNOSIS — R0789 Other chest pain: Secondary | ICD-10-CM | POA: Diagnosis not present

## 2020-02-28 DIAGNOSIS — R5383 Other fatigue: Secondary | ICD-10-CM

## 2020-02-28 MED ORDER — AMPHETAMINE-DEXTROAMPHET ER 20 MG PO CP24: 20.0000 mg | ORAL_CAPSULE | ORAL | 0 refills | Status: DC

## 2020-02-28 NOTE — Progress Notes (Signed)
Office Visit Note   Patient: Tricia Rodriguez           Date of Birth: [birth year]           MRN: [id number] Visit Date: 02/28/2020 Requested by: Laurey Morale, MD [place],  [place] [address] PCP: Laurey Morale, MD  Subjective: Chief Complaint  Patient presents with  . Other    "I feel like I've been run over by a mack truck,"  x several weeks. Body aches that come and go. "Tightness" in the chest. Slight cough, nonproductive. My head feels "off." Some postnasal drainage. Constant runny nose.    HPI: She is here with severe fatigue, slight congestion and chest tightness.  Symptoms started when she returned from [place] at the end of December.  At first she thought it was jet lag, but her symptoms persisted and none of her family had similar symptoms.  She was so tired that she can hardly get out of bed for a week.  Eventually she was able to get tested for Covid but it has been more than a week and her test was negative.  Her symptoms have persisted and she feels similar to when she has had bronchitis in the past, although she is not having a productive cough this time.  She has not had any swelling in her legs.  Denies any rash.  No vomiting or diarrhea.  She has not had fevers or chills recently.  She had a Covid booster in November.                ROS:   All other systems were reviewed and are negative.  Objective: Vital Signs: BP 119/78   Pulse 90   Temp 98.2 F (36.8 C)   SpO2 98%   Physical Exam:  General:  Alert and oriented, in no acute distress. Pulm:  Breathing unlabored. Psy:  Normal mood, congruent affect. Skin: No visible rash. HEENT:  [place]/AT, PERRLA, EOM Full, no nystagmus.  Funduscopic examination within normal limits.  No conjunctival erythema.  Tympanic membranes are pearly gray with normal landmarks.  External ear canals are normal.  Nasal passages are clear.  Oropharynx is clear.  No significant lymphadenopathy.  No thyromegaly or  nodules.  2+ carotid pulses without bruits. CV: Regular rate and rhythm without murmurs, rubs, or gallops.  No peripheral edema.  2+ radial and posterior tibial pulses. Lungs: Clear to auscultation throughout with no wheezing or areas of consolidation. Abdomen: No hepatosplenomegaly. Extremities: Negative Homans' sign, no swelling in the legs, no popliteal cords.     Imaging: No results found.  Assessment & Plan: 1.  Fatigue with cough and chest tightness, etiology uncertain.  Could be post Covid syndrome. -We will draw some labs today.  We will treat with Zithromax for possible bronchitis. -If D-dimer is positive, then chest CT scan.     Procedures: No procedures performed        PMFS History: Patient Active Problem List   Diagnosis Date Noted  . Anxiety, generalized 09/15/2017  . Menopausal symptom 06/16/2017  . Menorrhagia 06/16/2017  . 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 Anemia, iron deficiency 09/07/2014   Past Medical History:  Diagnosis Date  . Anemia   . Menorrhagia   . PONV (postoperative nausea and vomiting)   . Uterine fibroid   . Wears contact lenses     Family History  Problem Relation Age of Onset  . Alcohol  abuse Mother   . Arthritis Mother   . ALS Father   . Dementia Father   . Parkinson's disease Father   . Diabetes Maternal Grandmother   . Heart disease Maternal Grandmother   . Alcohol abuse Maternal Grandfather   . Diabetes Paternal Grandmother   . Healthy Son   . Allergies Daughter   . Depression Daughter   . Anxiety disorder Daughter   . Autoimmune disease Daughter        Positive ANA  . Cancer Neg Hx     Past Surgical History:  Procedure Laterality Date  . ANTERIOR CERVICAL DISCECTOMY  01/2019   per Dr. Ellene Route , C5-C6   . DILATION AND CURETTAGE OF UTERUS  2000  . DILITATION & CURRETTAGE/HYSTROSCOPY WITH NOVASURE ABLATION N/A 01/23/2015   Procedure: DILATATION &  CURETTAGE/HYSTEROSCOPY WITH RESECTOSCOPE;  Surgeon: Olga Millers, MD;  Location: [facility];  Service: Gynecology;  Laterality: N/A;  . KNEE SURGERY Bilateral 1980's  . SHOULDER SURGERY Left 01/2018  . SHOULDER SURGERY Right 11/2016  . SPINE SURGERY    . WRIST SURGERY  1999   Social History   Occupational History  . Not on file  Tobacco Use  . Smoking status: Never Smoker  . Smokeless tobacco: Never Used  Vaping Use  . Vaping Use: Never used  Substance and Sexual Activity  . Alcohol use: Yes    Alcohol/week: 0.0 standard drinks    Comment: occasional  . Drug use: No  . Sexual activity: Not on file

## 2020-02-29 ENCOUNTER — Ambulatory Visit: Payer: BC Managed Care – PPO | Admitting: Family Medicine

## 2020-03-01 ENCOUNTER — Telehealth: Payer: Self-pay | Admitting: Family Medicine

## 2020-03-01 DIAGNOSIS — Z8616 Personal history of COVID-19: Secondary | ICD-10-CM

## 2020-03-01 LAB — CBC WITH DIFFERENTIAL/PLATELET
Absolute Monocytes: 455 cells/uL (ref 200–950)
Basophils Absolute: 33 cells/uL (ref 0–200)
Basophils Relative: 0.5 %
Eosinophils Absolute: 59 cells/uL (ref 15–500)
Eosinophils Relative: 0.9 %
HCT: 42.2 % (ref 35.0–45.0)
Hemoglobin: 14.3 g/dL (ref 11.7–15.5)
Lymphs Abs: 1489 cells/uL (ref 850–3900)
MCH: 30.9 pg (ref 27.0–33.0)
MCHC: 33.9 g/dL (ref 32.0–36.0)
MCV: 91.1 fL (ref 80.0–100.0)
MPV: 11.2 fL (ref 7.5–12.5)
Monocytes Relative: 7 %
Neutro Abs: 4466 cells/uL (ref 1500–7800)
Neutrophils Relative %: 68.7 %
Platelets: 304 10*3/uL (ref 140–400)
RBC: 4.63 10*6/uL (ref 3.80–5.10)
RDW: 12.8 % (ref 11.0–15.0)
Total Lymphocyte: 22.9 %
WBC: 6.5 10*3/uL (ref 3.8–10.8)

## 2020-03-01 LAB — COMPREHENSIVE METABOLIC PANEL
AG Ratio: 1.8 (calc) (ref 1.0–2.5)
ALT: 14 U/L (ref 6–29)
AST: 21 U/L (ref 10–35)
Albumin: 4.8 g/dL (ref 3.6–5.1)
Alkaline phosphatase (APISO): 60 U/L (ref 37–153)
BUN: 12 mg/dL (ref 7–25)
CO2: 27 mmol/L (ref 20–32)
Calcium: 9.4 mg/dL (ref 8.6–10.4)
Chloride: 103 mmol/L (ref 98–110)
Creat: 0.57 mg/dL (ref 0.50–1.05)
Globulin: 2.6 g/dL (calc) (ref 1.9–3.7)
Glucose, Bld: 85 mg/dL (ref 65–99)
Potassium: 4 mmol/L (ref 3.5–5.3)
Sodium: 138 mmol/L (ref 135–146)
Total Bilirubin: 0.5 mg/dL (ref 0.2–1.2)
Total Protein: 7.4 g/dL (ref 6.1–8.1)

## 2020-03-01 LAB — SARS-COV-2 ANTIBODY(IGG)SPIKE,SEMI-QUANTITATIVE: SARS COV1 AB(IGG)SPIKE,SEMI QN: >150 index — ABNORMAL HIGH (ref <1.00)

## 2020-03-01 LAB — D-DIMER, QUANTITATIVE: D-Dimer, Quant: <0.19 mcg/mL FEU (ref <0.50)

## 2020-03-01 NOTE — Telephone Encounter (Signed)
Labs look good.  Covid antibodies are positive.

## 2020-03-08 ENCOUNTER — Other Ambulatory Visit: Payer: Self-pay

## 2020-03-08 ENCOUNTER — Ambulatory Visit (INDEPENDENT_AMBULATORY_CARE_PROVIDER_SITE_OTHER): Payer: BC Managed Care – PPO

## 2020-03-08 DIAGNOSIS — Z8616 Personal history of COVID-19: Secondary | ICD-10-CM

## 2020-03-08 NOTE — Addendum Note (Signed)
Addended by: Hortencia Pilar on: 03/08/2020 08:33 AM   Modules accepted: Orders

## 2020-03-08 NOTE — Progress Notes (Signed)
Lab only visit. SARS covid 2 antibody IgG nucleocapsid qualitative drawn, per Dr. Junius Roads.

## 2020-03-09 ENCOUNTER — Telehealth: Payer: Self-pay | Admitting: Family Medicine

## 2020-03-09 LAB — SARS-COV-2 ANTIBODY (IGG), NUCLEOCAPSID, QUALITATIVE: SARS CoV 2 AB IGG: NEGATIVE

## 2020-03-09 NOTE — Telephone Encounter (Signed)
Nucleocapsid antibodies were negative.

## 2020-03-30 MED ORDER — AMPHETAMINE-DEXTROAMPHET ER 20 MG PO CP24
20.0000 mg | ORAL_CAPSULE | ORAL | 0 refills | Status: DC
Start: 2020-03-30 — End: 2020-05-04

## 2020-03-30 NOTE — Addendum Note (Signed)
Addended by: Hortencia Pilar on: 03/30/2020 09:04 AM   Modules accepted: Orders

## 2020-05-04 ENCOUNTER — Other Ambulatory Visit: Payer: Self-pay | Admitting: Family Medicine

## 2020-05-04 MED ORDER — AMPHETAMINE-DEXTROAMPHET ER 20 MG PO CP24: 20.0000 mg | ORAL_CAPSULE | ORAL | 0 refills | Status: DC

## 2020-06-04 ENCOUNTER — Other Ambulatory Visit: Payer: Self-pay | Admitting: Family Medicine

## 2020-06-04 ENCOUNTER — Encounter: Payer: Self-pay | Admitting: Family Medicine

## 2020-06-05 ENCOUNTER — Other Ambulatory Visit: Payer: Self-pay | Admitting: Family Medicine

## 2020-06-05 ENCOUNTER — Encounter: Payer: Self-pay | Admitting: Family Medicine

## 2020-06-05 MED ORDER — FLUCONAZOLE 150 MG PO TABS: 150.0000 mg | ORAL_TABLET | ORAL | 0 refills | Status: DC

## 2020-06-05 MED ORDER — METHYLPREDNISOLONE 4 MG PO TBPK: ORAL_TABLET | ORAL | 0 refills | Status: DC

## 2020-06-05 MED ORDER — AZITHROMYCIN 250 MG PO TABS: ORAL_TABLET | ORAL | 0 refills | Status: DC

## 2020-06-05 MED ORDER — HYDROCODONE BIT-HOMATROP MBR 5-1.5 MG/5ML PO SOLN: 5.0000 mL | Freq: Four times a day (QID) | ORAL | 0 refills | Status: DC | PRN

## 2020-06-05 MED ORDER — AMPHETAMINE-DEXTROAMPHET ER 20 MG PO CP24
20.0000 mg | ORAL_CAPSULE | ORAL | 0 refills | Status: DC
Start: 2020-06-05 — End: 2020-09-21

## 2020-06-05 NOTE — Telephone Encounter (Signed)
Tested positive for covid.  Cough, chest tightness but no dyspnea; bad headache.  Will treat with cough syrup, medrol pack, z-pack.  Notify if worsens.

## 2020-06-08 NOTE — Addendum Note (Signed)
Addended by: Alexandre Lightsey on: 06/08/2020 01:48 PM   Modules accepted: Orders  

## 2020-09-21 ENCOUNTER — Other Ambulatory Visit: Payer: Self-pay | Admitting: Family Medicine

## 2020-09-21 MED ORDER — FLUCONAZOLE 150 MG PO TABS: 150.0000 mg | ORAL_TABLET | ORAL | 3 refills | Status: DC

## 2020-09-21 MED ORDER — AMPHETAMINE-DEXTROAMPHET ER 20 MG PO CP24: 20.0000 mg | ORAL_CAPSULE | ORAL | 0 refills | Status: DC

## 2020-09-25 ENCOUNTER — Other Ambulatory Visit: Payer: Self-pay | Admitting: Family Medicine

## 2020-09-26 NOTE — Telephone Encounter (Signed)
Pt notified through MyChart to call and schedule appointment for refill on her Xanax

## 2020-10-02 NOTE — Telephone Encounter (Signed)
Left a message for pt to call the office and schedule appointment for renewal of Xanax medication

## 2020-10-12 ENCOUNTER — Telehealth: Payer: Self-pay

## 2020-10-12 NOTE — Telephone Encounter (Signed)
I called Tricia Rodriguez. The patient is in [place] currently and unsure when she will be home. One of her friends was going to [place] soon for business and the patient was hoping she could get a refill on her alprazolam, to be sent with this friend to the patient. Dr. Junius Roads has not prescribed this for her, plus his last day in this office was 09/29/20. There are no other primary care doctors in this office. Dr. Sarajane Jews was the prescribing doctor. I can see the patient has been in touch with that office, which replied through [place] that she needs to be seen first. The patient cannot access MyChart where she is, per Tricia Rodriguez. Therefore, I suggested that she sees a doctor in [place] if she needs it before returning to the [place].

## 2020-10-12 NOTE — Telephone Encounter (Signed)
Patient's husband called. He is trying to get a refill on a medication. His call back number is [phone]

## 2021-01-02 ENCOUNTER — Ambulatory Visit (INDEPENDENT_AMBULATORY_CARE_PROVIDER_SITE_OTHER): Payer: BC Managed Care – PPO

## 2021-01-02 ENCOUNTER — Other Ambulatory Visit: Payer: Self-pay

## 2021-01-02 ENCOUNTER — Encounter: Payer: Self-pay | Admitting: Family Medicine

## 2021-01-02 ENCOUNTER — Ambulatory Visit (INDEPENDENT_AMBULATORY_CARE_PROVIDER_SITE_OTHER): Payer: BC Managed Care – PPO | Admitting: Family Medicine

## 2021-01-02 ENCOUNTER — Other Ambulatory Visit: Payer: Self-pay | Admitting: Obstetrics and Gynecology

## 2021-01-02 VITALS — BP 98/76 | HR 54 | Temp 97.9°F | Ht 64.5 in | Wt 104.0 lb

## 2021-01-02 DIAGNOSIS — M25561 Pain in right knee: Secondary | ICD-10-CM

## 2021-01-02 DIAGNOSIS — M25361 Other instability, right knee: Secondary | ICD-10-CM

## 2021-01-02 DIAGNOSIS — Z0000 Encounter for general adult medical examination without abnormal findings: Secondary | ICD-10-CM

## 2021-01-02 DIAGNOSIS — Z1231 Encounter for screening mammogram for malignant neoplasm of breast: Secondary | ICD-10-CM

## 2021-01-02 LAB — CBC WITH DIFFERENTIAL/PLATELET
Basophils Absolute: 0 10*3/uL (ref 0.0–0.1)
Basophils Relative: 0.4 % (ref 0.0–3.0)
Eosinophils Absolute: 0.1 10*3/uL (ref 0.0–0.7)
Eosinophils Relative: 1.1 % (ref 0.0–5.0)
HCT: 42.4 % (ref 36.0–46.0)
Hemoglobin: 14.2 g/dL (ref 12.0–15.0)
Lymphocytes Relative: 20.8 % (ref 12.0–46.0)
Lymphs Abs: 1.2 10*3/uL (ref 0.7–4.0)
MCHC: 33.4 g/dL (ref 30.0–36.0)
MCV: 94.2 fl (ref 78.0–100.0)
Monocytes Absolute: 0.6 10*3/uL (ref 0.1–1.0)
Monocytes Relative: 11.7 % (ref 3.0–12.0)
Neutro Abs: 3.7 10*3/uL (ref 1.4–7.7)
Neutrophils Relative %: 66 % (ref 43.0–77.0)
Platelets: 251 10*3/uL (ref 150.0–400.0)
RBC: 4.51 Mil/uL (ref 3.87–5.11)
RDW: 13.3 % (ref 11.5–15.5)
WBC: 5.5 10*3/uL (ref 4.0–10.5)

## 2021-01-02 LAB — TSH: TSH: 0.66 u[IU]/mL (ref 0.35–5.50)

## 2021-01-02 LAB — HEPATIC FUNCTION PANEL
ALT: 41 U/L — ABNORMAL HIGH (ref 0–35)
AST: 46 U/L — ABNORMAL HIGH (ref 0–37)
Albumin: 4.7 g/dL (ref 3.5–5.2)
Alkaline Phosphatase: 58 U/L (ref 39–117)
Bilirubin, Direct: 0.1 mg/dL (ref 0.0–0.3)
Total Bilirubin: 0.6 mg/dL (ref 0.2–1.2)
Total Protein: 7.3 g/dL (ref 6.0–8.3)

## 2021-01-02 LAB — C-REACTIVE PROTEIN: CRP: <1 mg/dL (ref 0.5–20.0)

## 2021-01-02 LAB — BASIC METABOLIC PANEL
BUN: 13 mg/dL (ref 6–23)
CO2: 27 mEq/L (ref 19–32)
Calcium: 9.2 mg/dL (ref 8.4–10.5)
Chloride: 103 mEq/L (ref 96–112)
Creatinine, Ser: 0.71 mg/dL (ref 0.40–1.20)
GFR: 97.46 mL/min (ref >60.00)
Glucose, Bld: 90 mg/dL (ref 70–99)
Potassium: 4.3 mEq/L (ref 3.5–5.1)
Sodium: 136 mEq/L (ref 135–145)

## 2021-01-02 LAB — T4, FREE: Free T4: 0.72 ng/dL (ref 0.60–1.60)

## 2021-01-02 LAB — T3, FREE: T3, Free: 3.6 pg/mL (ref 2.3–4.2)

## 2021-01-02 LAB — LIPID PANEL
Cholesterol: 208 mg/dL — ABNORMAL HIGH (ref 0–200)
HDL: 87.1 mg/dL (ref >39.00)
LDL Cholesterol: 108 mg/dL — ABNORMAL HIGH (ref 0–99)
NonHDL: 121.19
Total CHOL/HDL Ratio: 2
Triglycerides: 68 mg/dL (ref 0.0–149.0)
VLDL: 13.6 mg/dL (ref 0.0–40.0)

## 2021-01-02 LAB — SEDIMENTATION RATE: Sed Rate: 2 mm/hr (ref 0–30)

## 2021-01-02 LAB — HEMOGLOBIN A1C: Hgb A1c MFr Bld: 5.2 % (ref 4.6–6.5)

## 2021-01-02 MED ORDER — AMPHETAMINE-DEXTROAMPHETAMINE 20 MG PO TABS: 20.0000 mg | ORAL_TABLET | Freq: Two times a day (BID) | ORAL | 0 refills | Status: DC

## 2021-01-02 MED ORDER — DIAZEPAM 5 MG PO TABS: 5.0000 mg | ORAL_TABLET | Freq: Two times a day (BID) | ORAL | 2 refills | Status: DC | PRN

## 2021-01-02 MED ORDER — AMPHETAMINE-DEXTROAMPHETAMINE 20 MG PO TABS
20.0000 mg | ORAL_TABLET | Freq: Two times a day (BID) | ORAL | 0 refills | Status: DC
Start: 2021-01-02 — End: 2021-01-02

## 2021-01-02 NOTE — Progress Notes (Signed)
Subjective:    Patient ID: Tricia Rodriguez, female    DOB: Jun 12, 1970, 50 y.o.   MRN: [id number]  HPI Here for a well exam and to fill out papers to allow her to return to her [place] program in [place]. 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 She has papers that need to be completed to allow her to go back in January. She feels will in general. Her anxiety is stable, but she has found that Valium works better for her than Xanax (she tried of few from her friend). Also she is aware of the manufacturer's back log of Adderall XR, so she asks for an alternative.    Review of Systems  Constitutional: Negative.   HENT: Negative.    Eyes: Negative.   Respiratory: Negative.    Cardiovascular: Negative.   Gastrointestinal: Negative.   Genitourinary:  Negative for decreased urine volume, difficulty urinating, dyspareunia, dysuria, enuresis, flank pain, frequency, hematuria, pelvic pain and urgency.  Musculoskeletal: Negative.   Skin: Negative.   Neurological: Negative.  Negative for headaches.  Psychiatric/Behavioral:  Positive for sleep disturbance. The patient is nervous/anxious.       Objective:   Physical Exam Constitutional:      General: She is not in acute distress.    Appearance: Normal appearance. She is well-developed.  HENT:     Head: Normocephalic and atraumatic.     Right Ear: External ear normal.     Left Ear: External ear normal.     Nose: Nose normal.     Mouth/Throat:     Pharynx: No oropharyngeal exudate.  Eyes:     General: No scleral icterus.    Conjunctiva/sclera: Conjunctivae normal.     Pupils: Pupils are equal, round, and reactive to light.  Neck:     Thyroid: No thyromegaly.     Vascular: No JVD.  Cardiovascular:     Rate and Rhythm: Normal rate and regular rhythm.     Heart sounds: Normal heart sounds. No murmur heard.   No friction rub. No gallop.  Pulmonary:     Effort:  Pulmonary effort is normal. No respiratory distress.     Breath sounds: Normal breath sounds. No wheezing or rales.  Chest:     Chest wall: No tenderness.  Abdominal:     General: Bowel sounds are normal. There is no distension.     Palpations: Abdomen is soft. There is no mass.     Tenderness: There is no abdominal tenderness. There is no guarding or rebound.  Musculoskeletal:        General: No tenderness. Normal range of motion.     Cervical back: Normal range of motion and neck supple.  Lymphadenopathy:     Cervical: No cervical adenopathy.  Skin:    General: Skin is warm and dry.     Findings: No erythema or rash.  Neurological:     Mental Status: She is alert and oriented to person, place, and time.     Cranial Nerves: No cranial nerve deficit.     Motor: No abnormal muscle tone.     Coordination: Coordination normal.     Deep Tendon Reflexes: Reflexes are normal and symmetric. Reflexes normal.  Psychiatric:        Behavior: Behavior normal.        Thought Content: Thought content normal.        Judgment: Judgment normal.  Assessment & Plan:  Well exam. We discussed diet and exercise. Get fasting labs. She will try Valium 5 mg instead of Xanax. She will try Adderall 20 mg BID instead of Adderall XR. We will complete the forms to allow her to return to the program in [place].  Alysia Penna, MD

## 2021-01-03 ENCOUNTER — Telehealth: Payer: Self-pay

## 2021-01-03 NOTE — Telephone Encounter (Signed)
Spoke with pt aware to pick up letter from the office, state she will pick up tomorrow morning

## 2021-01-03 NOTE — Telephone Encounter (Signed)
Called pt aware to pick up her paperwork from the office, state that she will come by tomorrow morning to pick up, pt paperwork placed in the cabinet at the front office.

## 2021-01-04 LAB — ANTI-DNA ANTIBODY, DOUBLE-STRANDED: ds DNA Ab: <1 IU/mL

## 2021-01-04 LAB — ANA: Anti Nuclear Antibody (ANA): POSITIVE — AB

## 2021-01-04 LAB — ANTI-NUCLEAR AB-TITER (ANA TITER): ANA Titer 1: 1:80 {titer} — ABNORMAL HIGH

## 2021-01-04 LAB — RHEUMATOID FACTOR: Rheumatoid fact SerPl-aCnc: <14 IU/mL (ref <14)

## 2021-01-09 NOTE — Telephone Encounter (Signed)
As far as the liver enzymes, yes what she took in [place] could easily explain why they are up slightly. Also I reprinted her letter for [place] on blue paper and I signed it in blue ink. She can pick this up.

## 2021-01-15 ENCOUNTER — Ambulatory Visit
Admission: RE | Admit: 2021-01-15 | Discharge: 2021-01-15 | Disposition: A | Discharge status: Home / Self Care | Payer: BC Managed Care – PPO | Source: Ambulatory Visit | Attending: Obstetrics and Gynecology | Admitting: Obstetrics and Gynecology

## 2021-01-15 DIAGNOSIS — Z1231 Encounter for screening mammogram for malignant neoplasm of breast: Secondary | ICD-10-CM

## 2021-01-23 ENCOUNTER — Encounter: Payer: Self-pay | Admitting: Physician Assistant

## 2021-01-23 ENCOUNTER — Telehealth: Payer: Self-pay | Admitting: *Deleted

## 2021-01-23 ENCOUNTER — Other Ambulatory Visit: Payer: Self-pay

## 2021-01-23 ENCOUNTER — Ambulatory Visit: Payer: BC Managed Care – PPO | Admitting: Physician Assistant

## 2021-01-23 DIAGNOSIS — L570 Actinic keratosis: Secondary | ICD-10-CM | POA: Diagnosis not present

## 2021-01-23 DIAGNOSIS — Z808 Family history of malignant neoplasm of other organs or systems: Secondary | ICD-10-CM | POA: Diagnosis not present

## 2021-01-23 DIAGNOSIS — Z1283 Encounter for screening for malignant neoplasm of skin: Secondary | ICD-10-CM

## 2021-01-23 MED ORDER — FLUOROURACIL 5 % EX CREA: TOPICAL_CREAM | CUTANEOUS | 0 refills | Status: AC

## 2021-01-23 MED ORDER — TRETINOIN 0.05 % EX CREA: TOPICAL_CREAM | Freq: Every day | CUTANEOUS | 6 refills | Status: AC
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
## 2021-01-23 NOTE — Telephone Encounter (Signed)
Tretinoin prescription sent to [facility] pharmacy for patient.

## 2021-02-08 NOTE — Telephone Encounter (Signed)
Set up an OV so we can address these issues

## 2021-02-12 ENCOUNTER — Telehealth: Payer: Self-pay | Admitting: Family Medicine

## 2021-02-12 MED ORDER — AMPHETAMINE-DEXTROAMPHETAMINE 20 MG PO TABS: 20.0000 mg | ORAL_TABLET | Freq: Two times a day (BID) | ORAL | 0 refills | Status: DC

## 2021-02-12 MED ORDER — ALPRAZOLAM 0.5 MG PO TABS: 0.5000 mg | ORAL_TABLET | Freq: Two times a day (BID) | ORAL | 1 refills | Status: DC | PRN

## 2021-02-12 NOTE — Telephone Encounter (Signed)
Patient called to have amphetamine-dextroamphetamine (ADDERALL) 20 MG tablet switched to the   [facility]  Phone  [phone]       Please advise

## 2021-02-12 NOTE — Telephone Encounter (Signed)
Done

## 2021-02-12 NOTE — Telephone Encounter (Signed)
Request has been sent to Dr. Sarajane Jews from patient in my chart message.

## 2021-02-12 NOTE — Telephone Encounter (Signed)
I sent in the Xanax

## 2021-02-13 NOTE — Telephone Encounter (Signed)
Prescription has been sent.   Message complete.

## 2021-02-14 ENCOUNTER — Encounter: Payer: Self-pay | Admitting: Family Medicine

## 2021-02-14 ENCOUNTER — Ambulatory Visit: Payer: BC Managed Care – PPO | Admitting: Family Medicine

## 2021-02-14 VITALS — BP 110/76 | HR 68 | Temp 98.5°F | Wt 103.2 lb

## 2021-02-14 DIAGNOSIS — F900 Attention-deficit hyperactivity disorder, predominantly inattentive type: Secondary | ICD-10-CM | POA: Diagnosis not present

## 2021-02-14 DIAGNOSIS — R748 Abnormal levels of other serum enzymes: Secondary | ICD-10-CM | POA: Diagnosis not present

## 2021-02-14 LAB — HEPATIC FUNCTION PANEL
ALT: 18 U/L (ref 0–35)
AST: 22 U/L (ref 0–37)
Albumin: 4.6 g/dL (ref 3.5–5.2)
Alkaline Phosphatase: 51 U/L (ref 39–117)
Bilirubin, Direct: 0.1 mg/dL (ref 0.0–0.3)
Total Bilirubin: 0.6 mg/dL (ref 0.2–1.2)
Total Protein: 7.3 g/dL (ref 6.0–8.3)

## 2021-02-14 MED ORDER — AMPHETAMINE-DEXTROAMPHETAMINE 20 MG PO TABS: 20.0000 mg | ORAL_TABLET | Freq: Two times a day (BID) | ORAL | 0 refills | Status: AC

## 2021-02-14 MED ORDER — AMPHETAMINE-DEXTROAMPHETAMINE 20 MG PO TABS: 20.0000 mg | ORAL_TABLET | Freq: Two times a day (BID) | ORAL | 0 refills | Status: DC

## 2021-02-14 NOTE — Progress Notes (Signed)
° °  Subjective:    Patient ID: Tricia Rodriguez, female    DOB: 08/06/1970, 51 y.o.   MRN: [id number]  HPI Here for several issues. First she asks for help with refills on Adderall. She will be leaving the [place] in about 4 weeks to spend a year in [place] learning [place]. She has talked to her pharmacist and to her insurance company about getting her a 6 month supply of this medication to take with her to [place]. Apparently she can simply present 6 30-day prescriptions and they can be filled at the same time. Also she asks to check her liver enzymes again. At her physical on 01-02-21 her AST was 46 and her ALT was 41.    Review of Systems  Constitutional: Negative.   Respiratory: Negative.    Cardiovascular: Negative.   Gastrointestinal: Negative.   Neurological: Negative.   Psychiatric/Behavioral: Negative.        Objective:   Physical Exam Constitutional:      Appearance: Normal appearance.  Cardiovascular:     Rate and Rhythm: Normal rate and regular rhythm.     Pulses: Normal pulses.     Heart sounds: Normal heart sounds.  Pulmonary:     Effort: Pulmonary effort is normal.     Breath sounds: Normal breath sounds.  Neurological:     General: No focal deficit present.     Mental Status: She is alert. Mental status is at baseline.  Psychiatric:        Mood and Affect: Mood normal.        Behavior: Behavior normal.        Thought Content: Thought content normal.          Assessment & Plan:  We will check a hepatic panel today to follow up on the transaminases. We also wrote for six 30-day supplies of Adderall for her ADHD. Recheck as needed.  Alysia Penna, MD

## 2021-02-21 ENCOUNTER — Encounter: Payer: Self-pay | Admitting: Physician Assistant

## 2021-02-21 ENCOUNTER — Other Ambulatory Visit: Payer: Self-pay

## 2021-02-21 ENCOUNTER — Ambulatory Visit: Payer: BC Managed Care – PPO | Admitting: Physician Assistant

## 2021-02-21 DIAGNOSIS — D485 Neoplasm of uncertain behavior of skin: Secondary | ICD-10-CM | POA: Diagnosis not present

## 2021-02-21 DIAGNOSIS — Z808 Family history of malignant neoplasm of other organs or systems: Secondary | ICD-10-CM | POA: Diagnosis not present

## 2021-02-21 DIAGNOSIS — L570 Actinic keratosis: Secondary | ICD-10-CM

## 2021-02-21 NOTE — Progress Notes (Signed)
Follow-Up Visit   Subjective  Tricia Rodriguez is a 51 y.o. female who presents for the following: Follow-up (Recheck spots on face that were LN2 last office visit- left temple scaly- x months- no frozen last office visit, scaly spot on left hand as well. No personal history of non mole skin cancer or melanoma. Family history of melanoma. ).   The following portions of the chart were reviewed this encounter and updated as appropriate:  Tobacco   Allergies   Meds   Problems   Med Hx   Surg Hx   Fam Hx       Objective  Well appearing patient in no apparent distress; mood and affect are within normal limits.  All skin waist up examined.  Left 2nd Finger Metacarpophalangeal Joint Hyperkeratotic scale with pink base        Left Upper Arm - Anterior Scaly erythematous macule            Left Temple (3), Right Dorsal Hand (3) Erythematous patches with gritty scale.   Assessment & Plan  Neoplasm of uncertain behavior of skin (2) Left 2nd Finger Metacarpophalangeal Joint  Skin / nail biopsy Type of biopsy: tangential   Informed consent: discussed and consent obtained   Timeout: patient name, date of birth, surgical site, and procedure verified   Procedure prep:  Patient was prepped and draped in usual sterile fashion (Non sterile) Prep type:  Chlorhexidine Anesthesia: the lesion was anesthetized in a standard fashion   Anesthetic:  1% lidocaine w/ epinephrine 1-100,000 local infiltration Instrument used: flexible razor blade   Outcome: patient tolerated procedure well   Post-procedure details: wound care instructions given    Destruction of lesion Complexity: simple   Destruction method: electrodesiccation and curettage   Informed consent: discussed and consent obtained   Timeout:  patient name, date of birth, surgical site, and procedure verified Anesthesia: the lesion was anesthetized in a standard fashion   Anesthetic:  1% lidocaine w/ epinephrine  1-100,000 local infiltration Curettage performed in three different directions: Yes   Electrodesiccation performed over the curetted area: Yes   Curettage cycles:  1 Margin per side (cm):  0.1 Final wound size (cm):  2 Hemostasis achieved with:  aluminum chloride Outcome: patient tolerated procedure well with no complications   Post-procedure details: wound care instructions given    Specimen 1 - Surgical pathology Differential Diagnosis: bcc vs [id number]-[id number]  Check Margins: No  Left Upper Arm - Anterior  Skin / nail biopsy Type of biopsy: tangential   Informed consent: discussed and consent obtained   Timeout: patient name, date of birth, surgical site, and procedure verified   Procedure prep:  Patient was prepped and draped in usual sterile fashion (Non sterile) Prep type:  Chlorhexidine Anesthesia: the lesion was anesthetized in a standard fashion   Anesthetic:  1% lidocaine w/ epinephrine 1-100,000 local infiltration Instrument used: flexible razor blade   Outcome: patient tolerated procedure well   Post-procedure details: wound care instructions given    Destruction of lesion Complexity: simple   Destruction method: electrodesiccation and curettage   Informed consent: discussed and consent obtained   Timeout:  patient name, date of birth, surgical site, and procedure verified Anesthesia: the lesion was anesthetized in a standard fashion   Anesthetic:  1% lidocaine w/ epinephrine 1-100,000 local infiltration Curettage performed in three different directions: Yes   Electrodesiccation performed over the curetted area: Yes   Curettage cycles:  1 Margin per side (cm):  0.1 Final  wound size (cm):  1.3 Hemostasis achieved with:  aluminum chloride Outcome: patient tolerated procedure well with no complications   Post-procedure details: wound care instructions given    Specimen 2 - Surgical pathology Differential Diagnosis: bcc vs scc-txpbx  Check Margins: No  AK (actinic  keratosis) (6) Right Dorsal Hand (3); Left Temple (3)  Destruction of lesion - Left Temple, Right Dorsal Hand Complexity: simple   Destruction method: cryotherapy   Informed consent: discussed and consent obtained   Timeout:  patient name, date of birth, surgical site, and procedure verified Lesion destroyed using liquid nitrogen: Yes   Cryotherapy cycles:  3 Outcome: patient tolerated procedure well with no complications    Related Medications fluorouracil (EFUDEX) 5 % cream APPLY TO AFFECTED AREA NIGHTLY x 2 weeks   No atypical nevi noted at the time of the visit. I, Toni Hoffmeister, PA-C, have reviewed all documentation's for this visit.  The documentation on 02/21/21 for the exam, diagnosis, procedures and orders are all accurate and complete.

## 2021-02-21 NOTE — Patient Instructions (Signed)

## 2021-02-27 ENCOUNTER — Telehealth: Payer: Self-pay

## 2021-02-27 NOTE — Telephone Encounter (Signed)
-----   Message from Warren Danes, [place] sent at 02/26/2021  2:51 PM EST ----- Precancers- treated. Encourage her to use the  fluorouracil.

## 2021-02-27 NOTE — Telephone Encounter (Signed)
Phone call to patient with her pathology results. Patient aware of results.  

## 2021-03-05 MED ORDER — ALPRAZOLAM 0.5 MG PO TABS: 0.5000 mg | ORAL_TABLET | Freq: Two times a day (BID) | ORAL | 0 refills | Status: DC | PRN

## 2021-03-05 NOTE — Telephone Encounter (Signed)
I just sent in a 90 day supply. Please call her pharmacy to tell them it is OK to fill this early because she is leaving the [place]

## 2021-03-06 NOTE — Telephone Encounter (Signed)
Spoke with pt pharamcy, advised that Dr Sarajane Jews approves early refill on her Xanax, verbalized understanding, pt was notified

## 2021-09-18 ENCOUNTER — Ambulatory Visit: Payer: BC Managed Care – PPO | Admitting: Family Medicine

## 2021-09-18 ENCOUNTER — Telehealth: Payer: Self-pay | Admitting: Family Medicine

## 2021-09-18 NOTE — Telephone Encounter (Signed)
Pt was scheduled for an OV today, but after speaking with the Triage nurse, she decided to cancel the appointment.  Triage nurse advised her to take an OTC.

## 2021-09-18 NOTE — Telephone Encounter (Signed)
--  Caller states has appointment at 1545 and c/o right lower abdominal pain. Caller states pain has been ongoing since 0315. Has been having sharp pain in her cervix area and has had work up for that. Does not feel it is related. Feels like she has a pocket of fluid in her belly. Does not think she has a fever, does not feel well at all per caller. Can touch belly on left, but on right it is 'very sensitive to touch about three inches over from her belly button'.  09/18/2021 12:58:09 PM See HCP within 4 Hours (or PCP triage) Velta Addison, RN, [place]  Comments User: Hamilton Capri, RN Date/Time (Eastern Time): 09/18/2021 1:01:28 PM Caller states she was calling due to she did not want to wait for appt this afternoon that she already has scheduled if they are going to send her somewhere else. I advised that would be dependent on what the Dr found with his evaluation but if she felt she needed to be see at an emergency room sooner I would definitely recommend going there.  Referrals REFERRED TO PCP OFFICE

## 2021-09-19 NOTE — Telephone Encounter (Signed)
Who is it she has an appt with today?

## 2021-09-19 NOTE — Telephone Encounter (Signed)
Pt had an appt scheduled with Dr. Elease Hashimoto on 09/18/21, which pt cancelled appointment.

## 2021-09-19 NOTE — Telephone Encounter (Signed)
No future OV scheduled.

## 2021-09-20 NOTE — Telephone Encounter (Signed)
Please call her and ask how she is feeling today. If she still has the pain, we can work her in this morning

## 2021-09-20 NOTE — Telephone Encounter (Signed)
Left datelined message for pt advised to call the office and let us know if she needs appointment with Dr Sarajane Jews

## 2021-10-06 ENCOUNTER — Other Ambulatory Visit: Payer: Self-pay | Admitting: Family Medicine

## 2021-10-17 ENCOUNTER — Telehealth: Payer: Self-pay

## 2021-10-17 NOTE — Telephone Encounter (Signed)
Attempted to call pt several times but no success. Send a MyChart message

## 2021-11-14 ENCOUNTER — Other Ambulatory Visit: Payer: Self-pay | Admitting: Family Medicine

## 2021-11-15 NOTE — Telephone Encounter (Signed)
Last OV-02/14/21 Last Refill-not on med list  No future OV scheduled.

## 2022-04-04 ENCOUNTER — Encounter: Payer: Self-pay | Admitting: Radiology

## 2022-07-06 IMAGING — MG MM DIGITAL SCREENING BILAT W/ TOMO AND CAD
8 series · 9 of 24 positions shown · non-contrast
Comparison: Previous exam(s).

CLINICAL DATA: Screening.

EXAM:
DIGITAL SCREENING BILATERAL MAMMOGRAM WITH TOMOSYNTHESIS AND CAD
TECHNIQUE: Bilateral screening digital craniocaudal and mediolateral oblique
mammograms were obtained. Bilateral screening digital breast
tomosynthesis was performed. The images were evaluated with
computer-aided detection.

[R CC synth-2D]
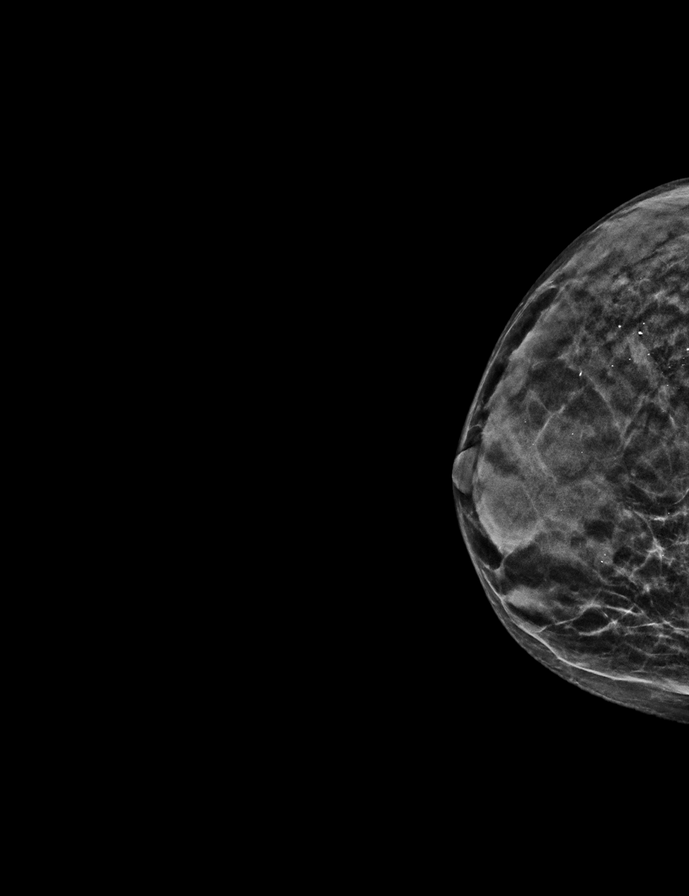

[L CC synth-2D]
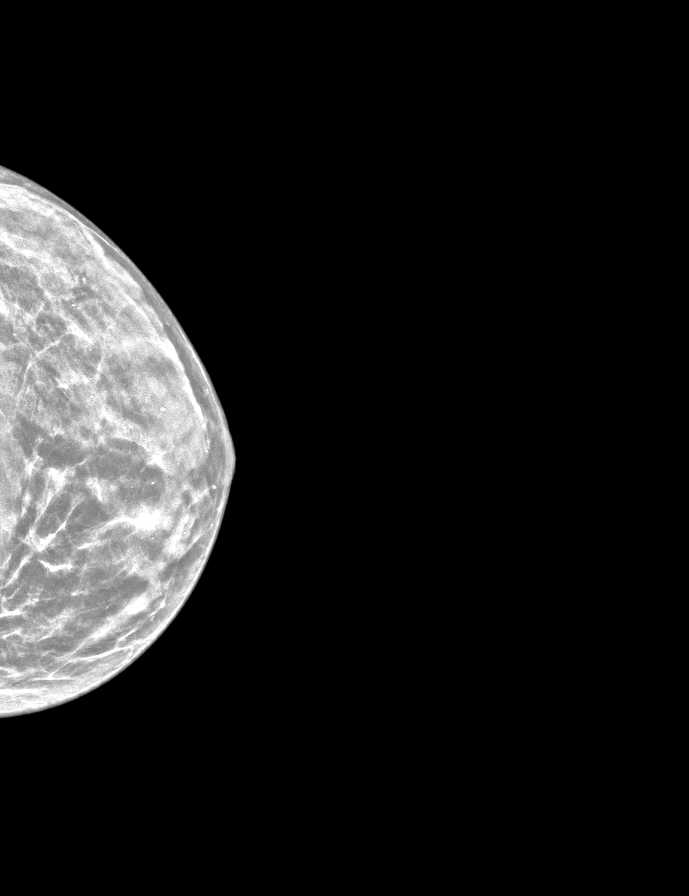

[R MLO synth-2D]
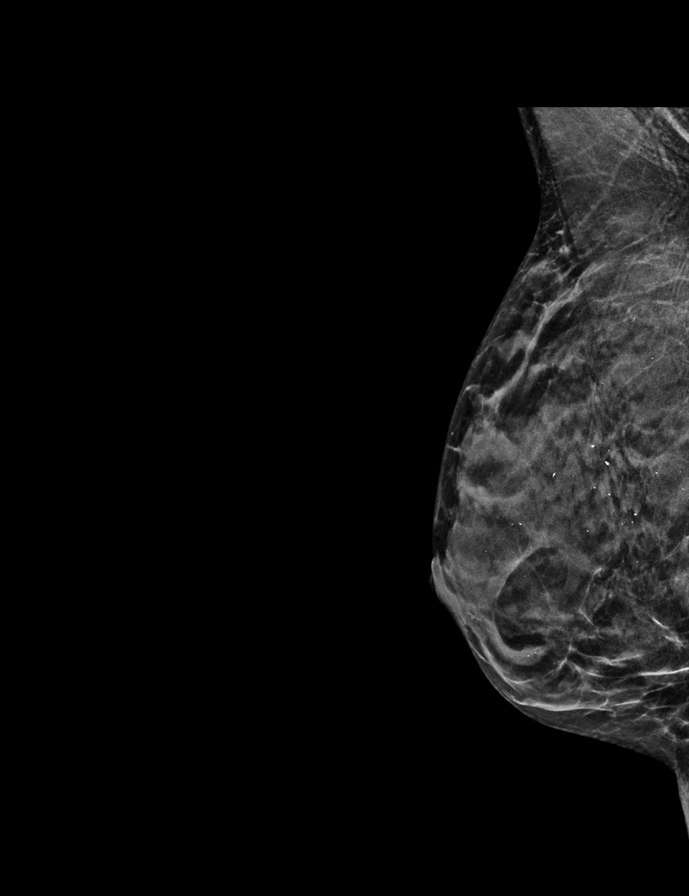

[L MLO synth-2D]
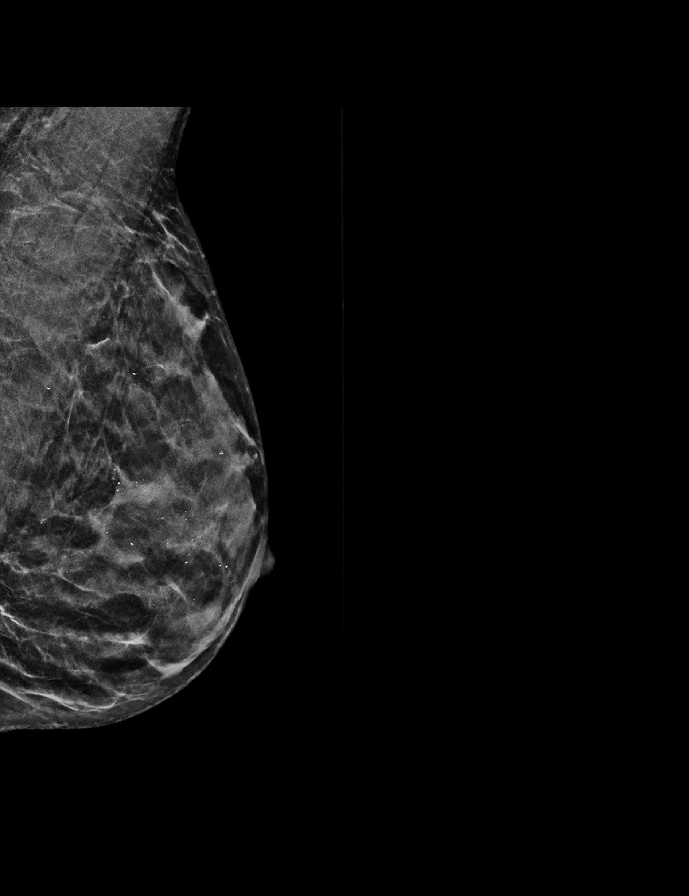

[L MLO tomo · 2 of 34 frames shown]
[frame 12/34]
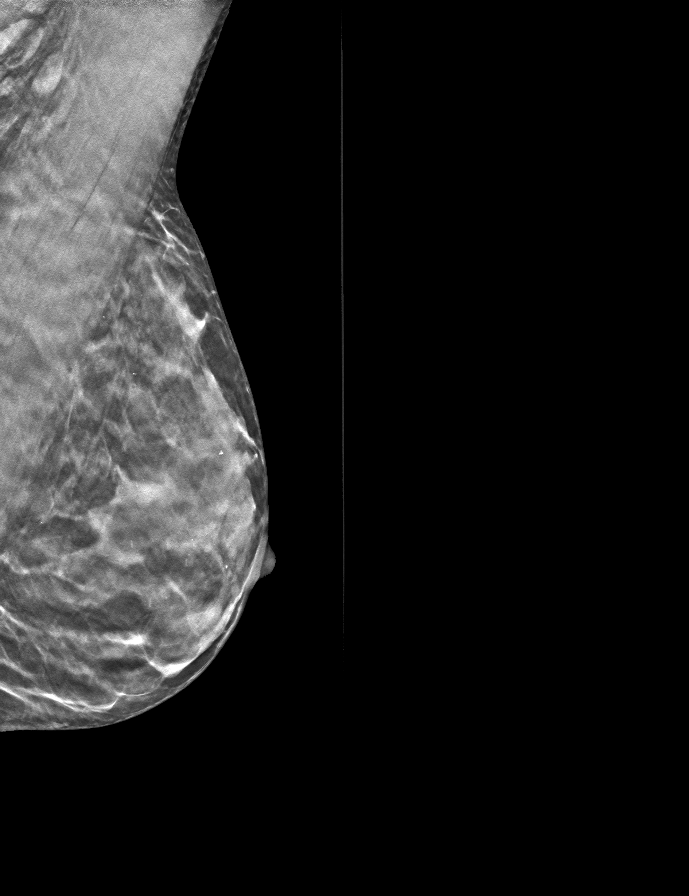
[frame 17/34]
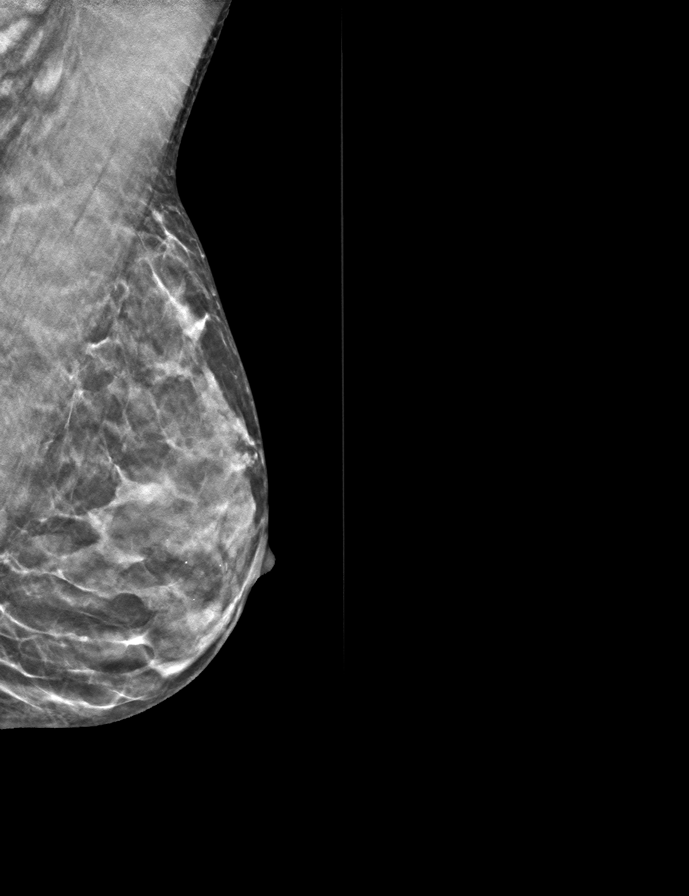

[R MLO tomo · tomo slice 19/37.0]
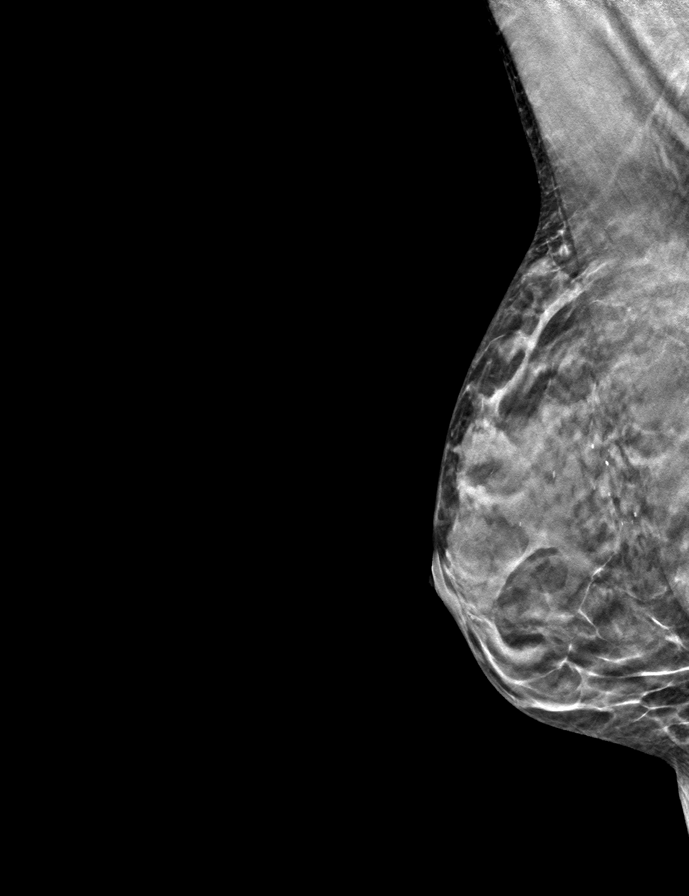

[L CC tomo · tomo slice 20/39.0]
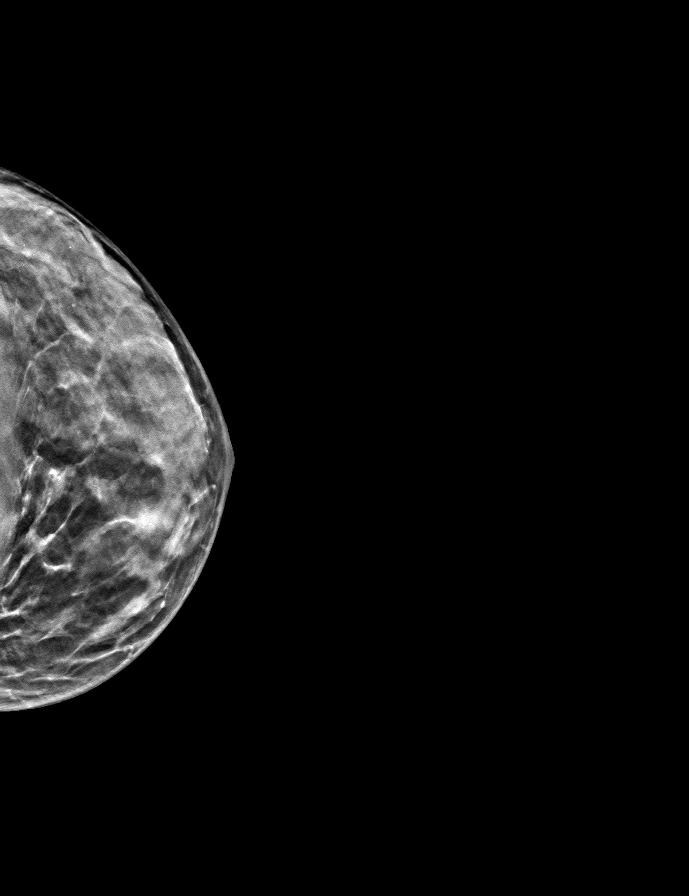

[R CC tomo · tomo slice 19/36.0]
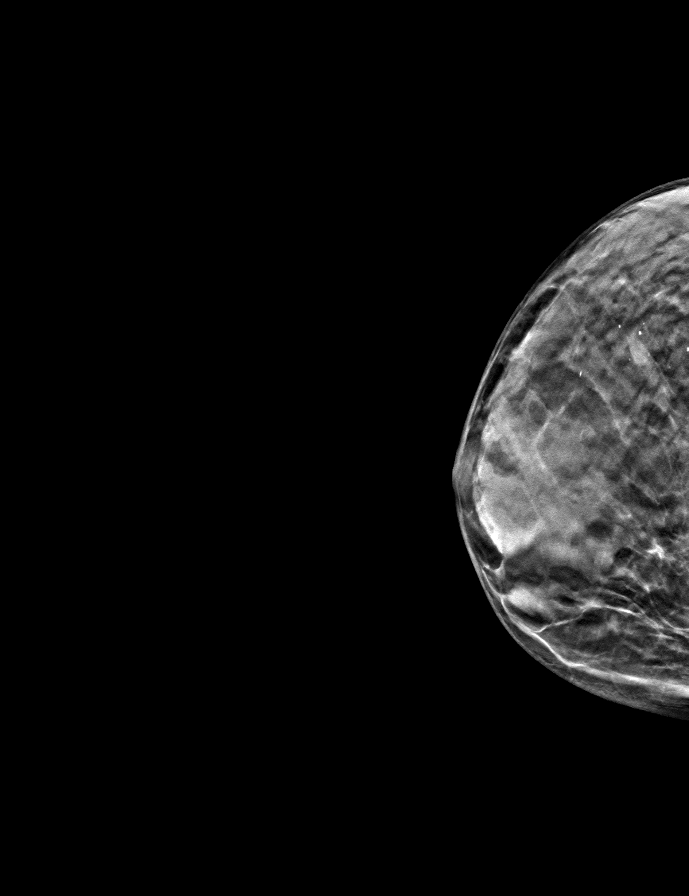

[9 of 24 positions shown; findings below may reference images not displayed]

ACR Breast Density Category c: The breast tissue is heterogeneously
dense, which may obscure small masses.
FINDINGS: There are no findings suspicious for malignancy.
IMPRESSION: No mammographic evidence of malignancy. A result letter of this
screening mammogram will be mailed directly to the patient.

RECOMMENDATION:
Screening mammogram in one year. (Code:[id number]-W-BC3)

BI-RADS CATEGORY  1: Negative.

## 2023-02-11 ENCOUNTER — Telehealth: Payer: Self-pay | Admitting: Family Medicine

## 2023-02-11 NOTE — Telephone Encounter (Signed)
 LM to schedule appt, has not been seen recently
